# Patient Record
Sex: Female | Born: 1935 | Race: White
Health system: Southern US, Community
[De-identification: ages and names within clinical notes are randomized; demographics above are authoritative.]

---

## 2004-07-05 ENCOUNTER — Inpatient Hospital Stay: Payer: Self-pay

## 2004-07-05 ENCOUNTER — Other Ambulatory Visit: Payer: Self-pay

## 2004-07-19 ENCOUNTER — Other Ambulatory Visit: Payer: Self-pay

## 2004-07-19 ENCOUNTER — Emergency Department: Payer: Self-pay | Admitting: General Practice

## 2004-07-25 ENCOUNTER — Ambulatory Visit: Payer: Self-pay | Admitting: Unknown Physician Specialty

## 2004-10-10 ENCOUNTER — Ambulatory Visit: Payer: Self-pay

## 2004-11-15 ENCOUNTER — Ambulatory Visit: Payer: Self-pay

## 2004-11-16 ENCOUNTER — Inpatient Hospital Stay: Payer: Self-pay | Admitting: Anesthesiology

## 2005-02-01 ENCOUNTER — Emergency Department: Payer: Self-pay | Admitting: Internal Medicine

## 2005-03-01 ENCOUNTER — Ambulatory Visit: Payer: Self-pay | Admitting: Physician Assistant

## 2005-03-25 ENCOUNTER — Ambulatory Visit: Payer: Self-pay | Admitting: Pain Medicine

## 2005-04-04 ENCOUNTER — Ambulatory Visit: Payer: Self-pay | Admitting: Pain Medicine

## 2005-04-29 ENCOUNTER — Ambulatory Visit: Payer: Self-pay | Admitting: Physician Assistant

## 2005-05-08 ENCOUNTER — Ambulatory Visit: Payer: Self-pay | Admitting: Physician Assistant

## 2005-05-08 ENCOUNTER — Encounter: Payer: Self-pay | Admitting: Pain Medicine

## 2005-05-16 ENCOUNTER — Ambulatory Visit: Payer: Self-pay | Admitting: Pain Medicine

## 2005-05-17 ENCOUNTER — Encounter: Payer: Self-pay | Admitting: Pain Medicine

## 2005-05-30 ENCOUNTER — Ambulatory Visit: Payer: Self-pay | Admitting: Pain Medicine

## 2005-06-06 ENCOUNTER — Ambulatory Visit: Payer: Self-pay | Admitting: Otolaryngology

## 2005-06-06 ENCOUNTER — Other Ambulatory Visit: Payer: Self-pay

## 2005-06-12 ENCOUNTER — Ambulatory Visit: Payer: Self-pay | Admitting: Otolaryngology

## 2005-08-15 ENCOUNTER — Ambulatory Visit: Payer: Self-pay | Admitting: Physician Assistant

## 2005-08-22 ENCOUNTER — Ambulatory Visit: Payer: Self-pay | Admitting: Pain Medicine

## 2005-08-30 ENCOUNTER — Ambulatory Visit: Payer: Self-pay | Admitting: Physician Assistant

## 2005-10-07 ENCOUNTER — Ambulatory Visit: Payer: Self-pay | Admitting: Physician Assistant

## 2005-10-15 ENCOUNTER — Ambulatory Visit: Payer: Self-pay | Admitting: Pain Medicine

## 2006-05-15 ENCOUNTER — Ambulatory Visit: Payer: Self-pay | Admitting: Unknown Physician Specialty

## 2006-06-09 ENCOUNTER — Other Ambulatory Visit: Payer: Self-pay

## 2006-06-09 ENCOUNTER — Ambulatory Visit: Payer: Self-pay | Admitting: Orthopaedic Surgery

## 2006-06-12 ENCOUNTER — Ambulatory Visit: Payer: Self-pay | Admitting: Orthopaedic Surgery

## 2006-06-24 ENCOUNTER — Encounter: Payer: Self-pay | Admitting: Orthopaedic Surgery

## 2006-07-17 ENCOUNTER — Encounter: Payer: Self-pay | Admitting: Orthopaedic Surgery

## 2006-09-20 ENCOUNTER — Emergency Department: Payer: Self-pay | Admitting: Emergency Medicine

## 2006-10-13 ENCOUNTER — Ambulatory Visit: Payer: Self-pay | Admitting: Vascular Surgery

## 2006-10-22 ENCOUNTER — Ambulatory Visit: Payer: Self-pay | Admitting: Gastroenterology

## 2006-12-09 ENCOUNTER — Ambulatory Visit: Payer: Self-pay | Admitting: Physician Assistant

## 2006-12-18 ENCOUNTER — Ambulatory Visit: Payer: Self-pay | Admitting: Pain Medicine

## 2007-01-19 ENCOUNTER — Ambulatory Visit: Payer: Self-pay | Admitting: Physician Assistant

## 2007-01-30 IMAGING — CR DG HIP COMPLETE 2+V*L*
1 series · 2 of 2 positions shown · non-contrast
Comparison: none

REASON FOR EXAM: Pain in hip
COMMENTS:

PROCEDURE:     DXR - DXR HIP LEFT COMPLETE  - February 01, 2005  [DATE]
RESULT:     The bones are osteopenic.  There does not appear to be evidence
of fracture or dislocation.  Degenerative changes are appreciated involving
the LEFT femoral acetabular joint.

[Series 1: view not recorded · 0.17mm/px · 2 of 2 slices shown]
[im 1/2]
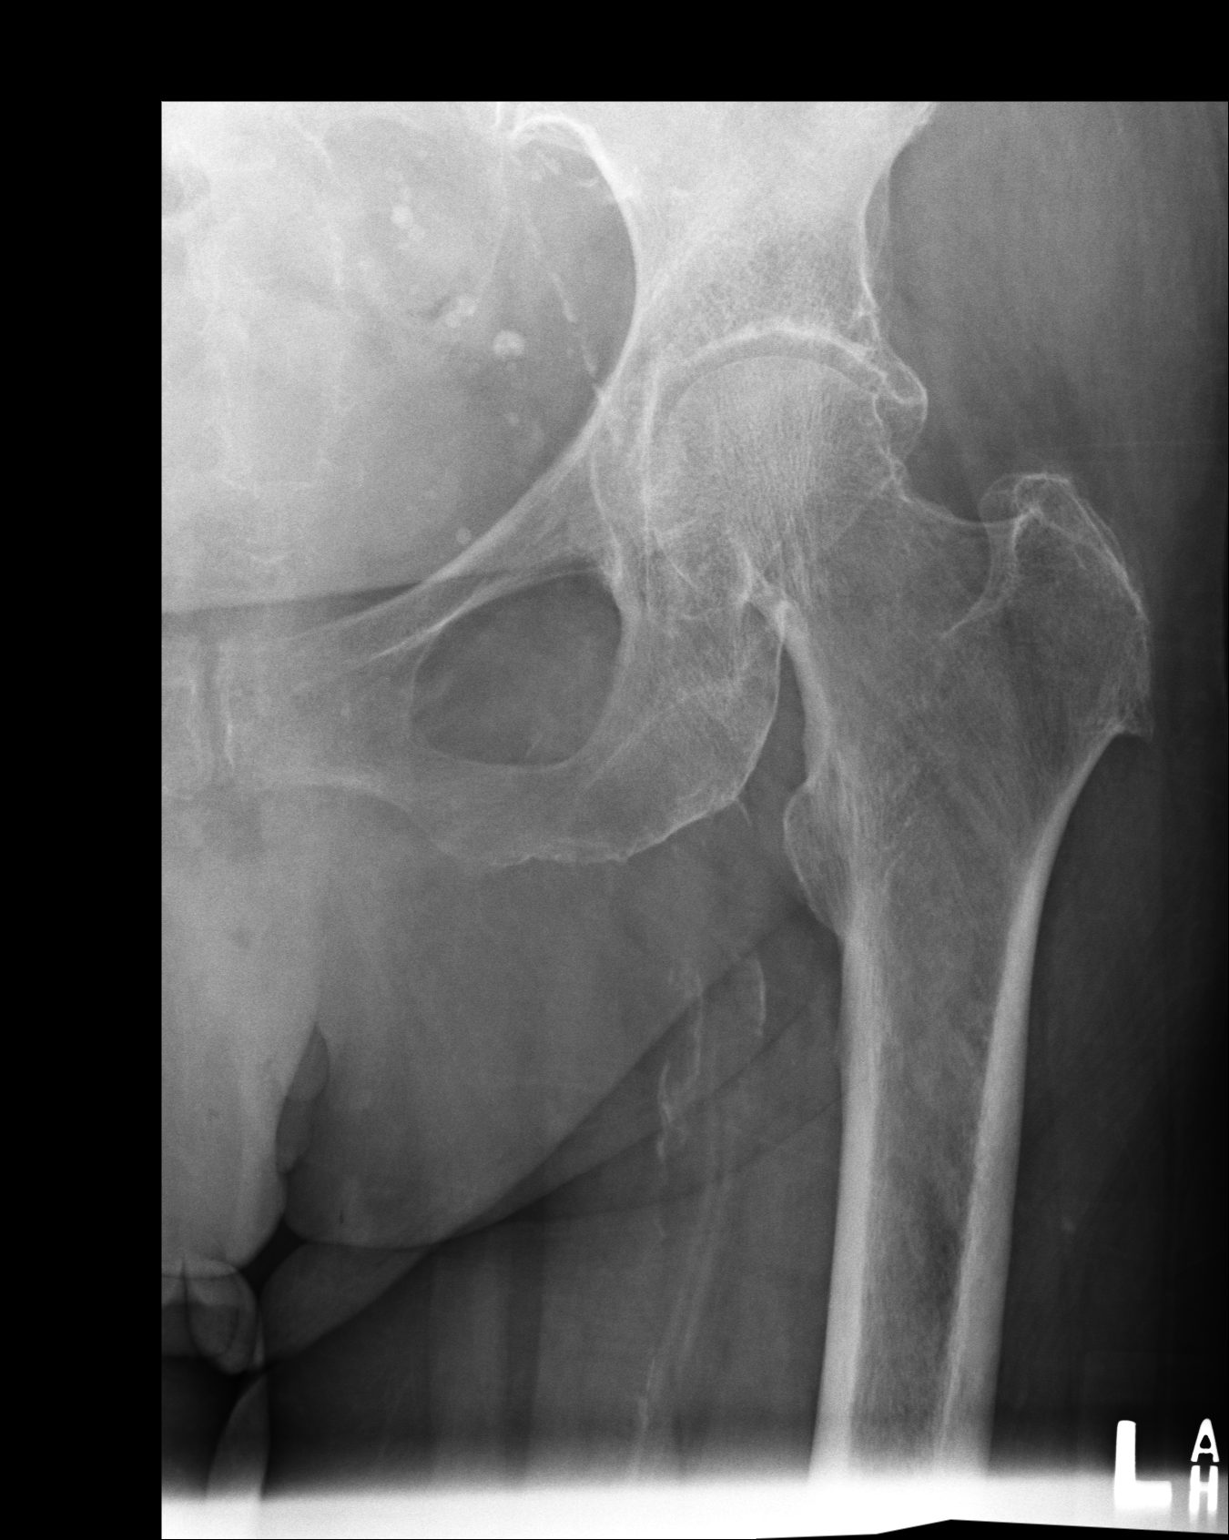
[im 2/2]
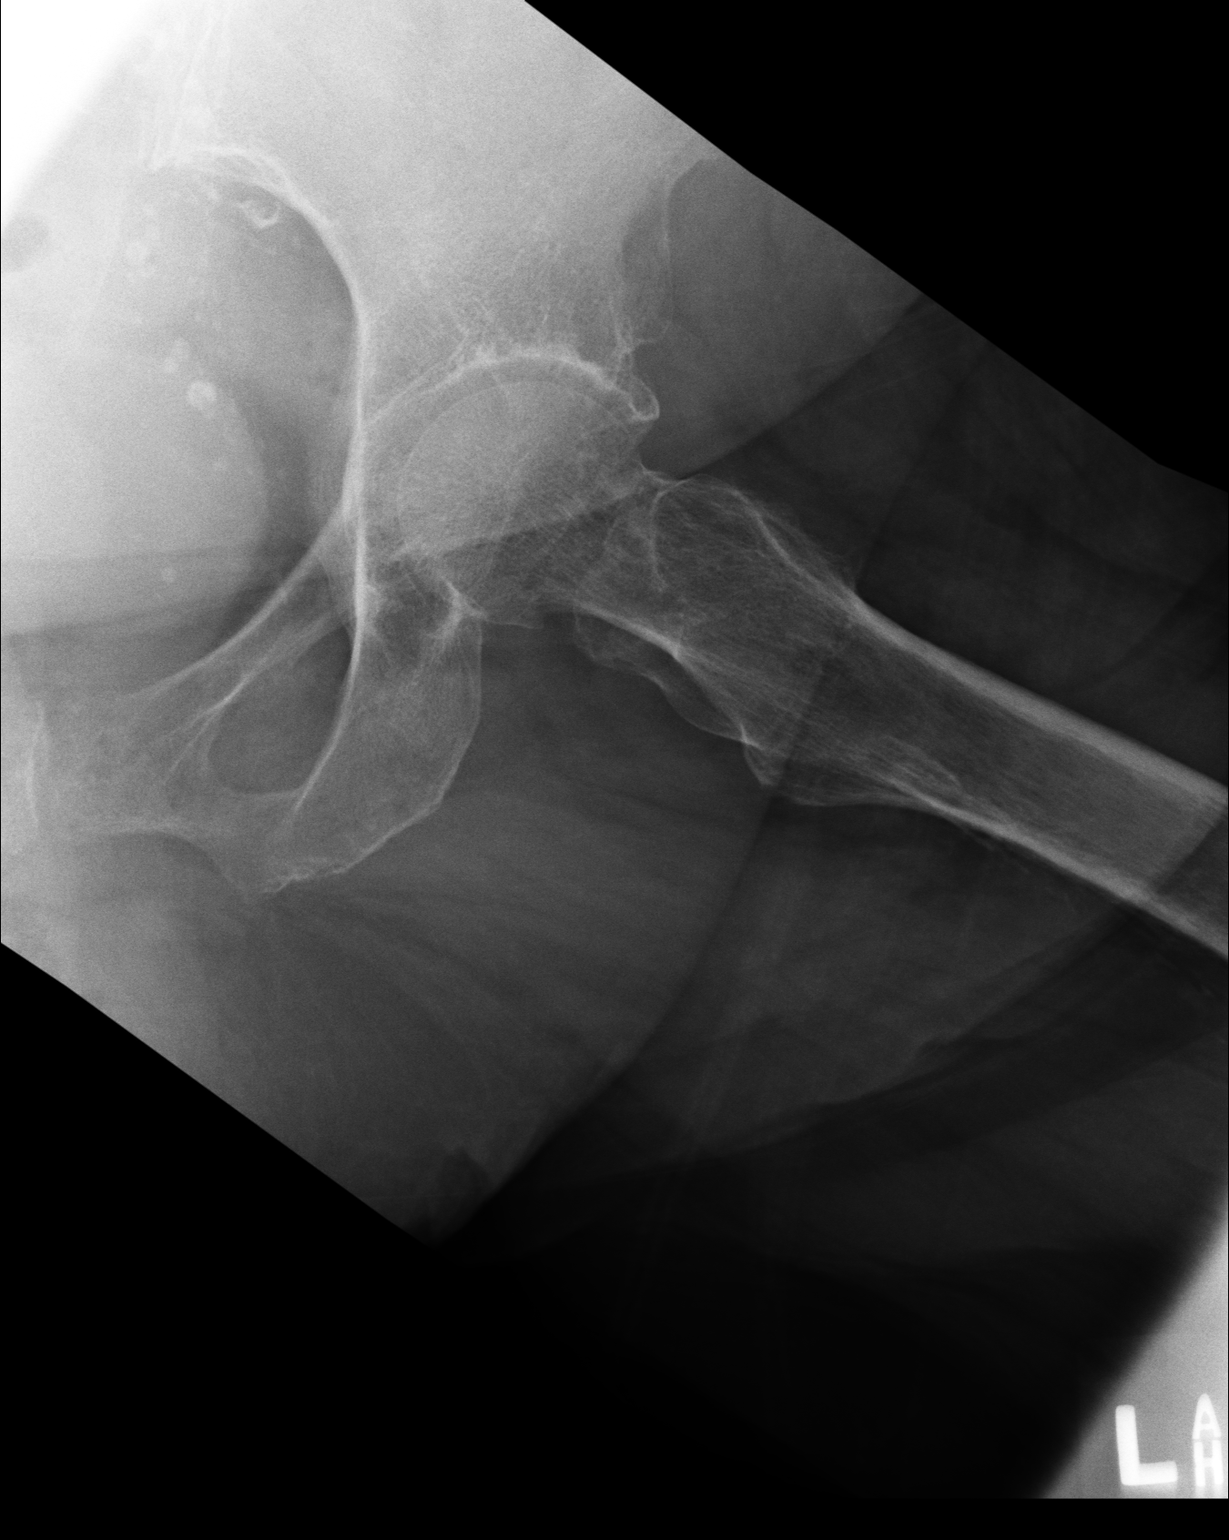

[2 of 2 positions shown; findings below may reference images not displayed]

IMPRESSION: See above.

## 2007-01-30 IMAGING — CR DG ABDOMEN 1V
1 series · 2 of 2 positions shown · non-contrast
Comparison: none

REASON FOR EXAM: Pain
COMMENTS:

[Series 1: view not recorded · 0.17mm/px · 2 of 2 slices shown]
[im 1/2]
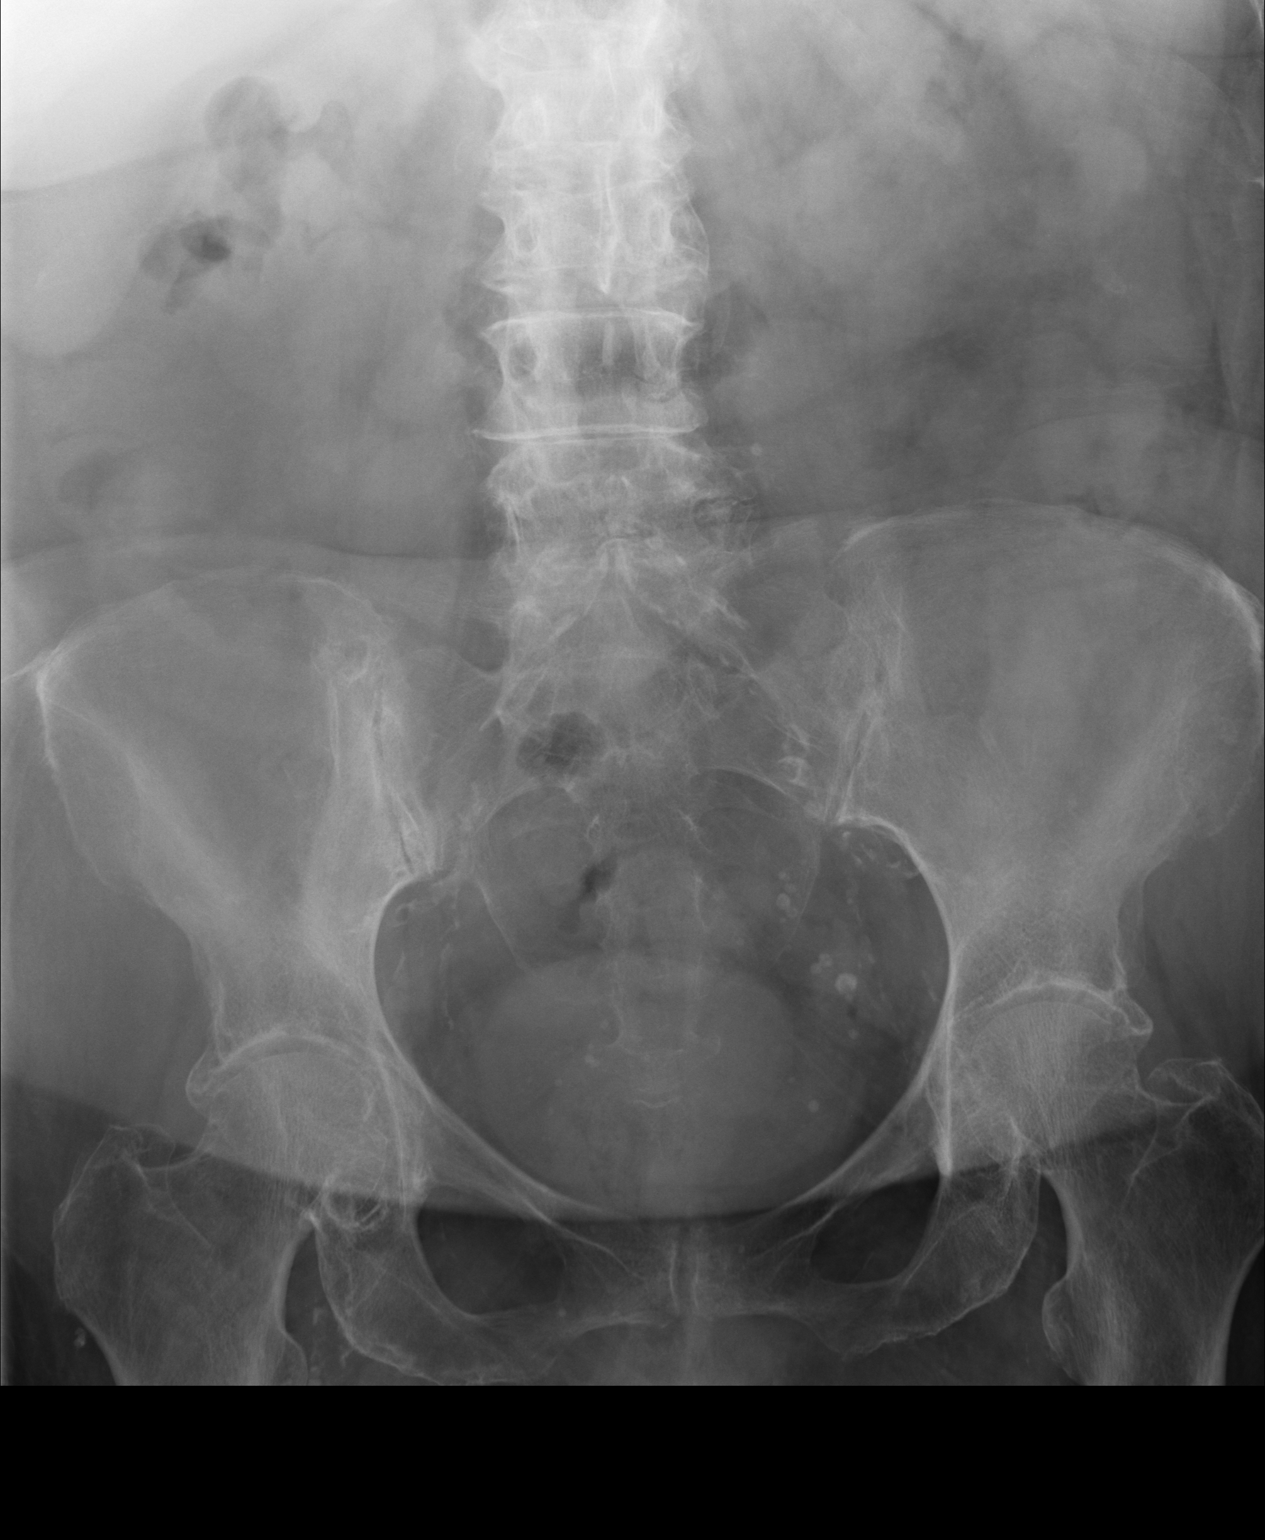
[im 2/2]
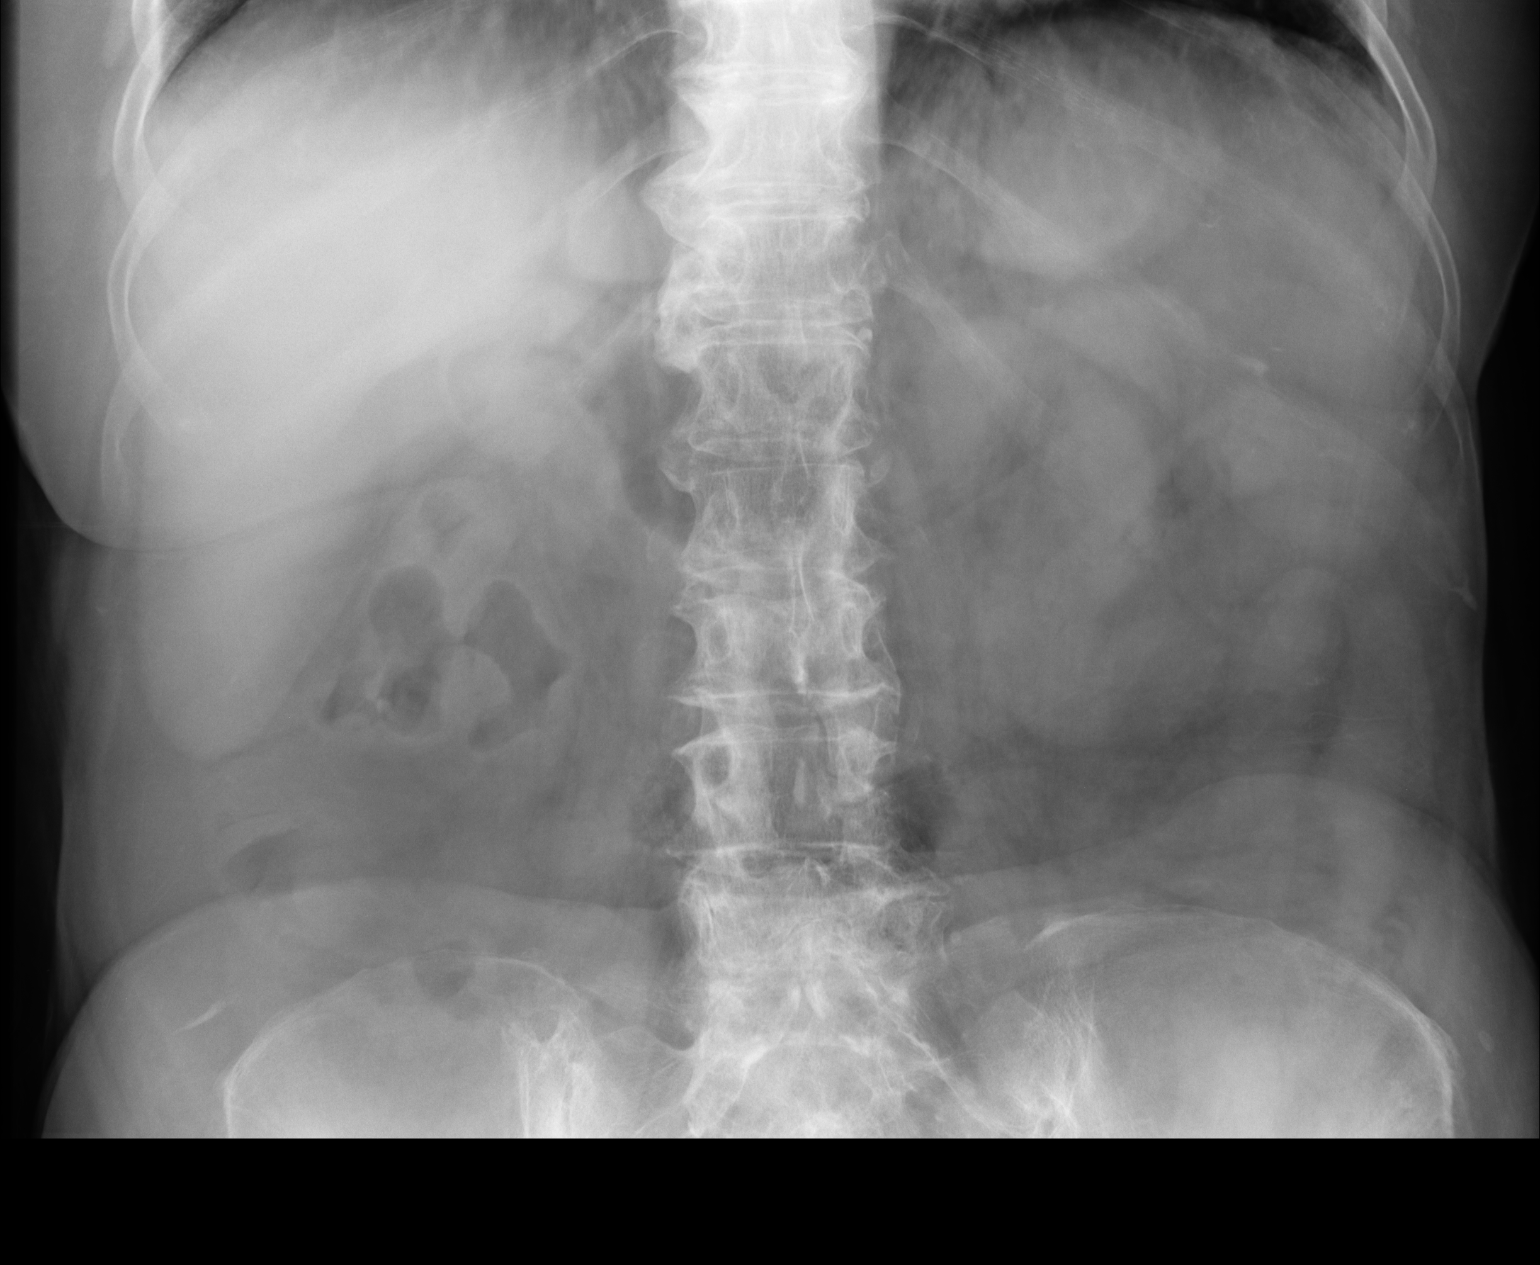

[2 of 2 positions shown; findings below may reference images not displayed]

PROCEDURE:     DXR - DXR KIDNEY URETER BLADDER  - February 01, 2005  [DATE]

RESULT:     Comparison is made to the prior study dated 03-24-00.

Air is seen within nondilated loops of large and small bowel.  There is a
paucity of bowel gas.  A moderate to large amount of stool is appreciated
throughout the abdomen.  Phleboliths are demonstrated within the pelvis as
well as vascular calcifications.  The visualized bony skeleton demonstrates
degenerative changes within the lower lumbar spine as well as involvement of
the sacroiliac joints and femoral acetabular joints.  The urinary bladder is
distended with urine.
IMPRESSION: Nonobstructive bowel gas pattern with degenerative changes within the
skeleton as described above.

## 2007-02-03 ENCOUNTER — Ambulatory Visit: Payer: Self-pay | Admitting: Pain Medicine

## 2007-02-11 ENCOUNTER — Ambulatory Visit: Payer: Self-pay | Admitting: Physician Assistant

## 2007-03-25 ENCOUNTER — Ambulatory Visit: Payer: Self-pay | Admitting: Physician Assistant

## 2007-04-10 ENCOUNTER — Ambulatory Visit: Payer: Self-pay | Admitting: Family Medicine

## 2007-04-13 ENCOUNTER — Inpatient Hospital Stay: Payer: Self-pay | Admitting: Gastroenterology

## 2007-04-28 ENCOUNTER — Ambulatory Visit: Payer: Self-pay | Admitting: Physician Assistant

## 2007-05-06 IMAGING — CR DG HIP COMPLETE 2+V*L*
1 series · 2 of 2 positions shown · non-contrast
Comparison: none

REASON FOR EXAM: LEFT hip pain (telephone results to physician)
COMMENTS:

PROCEDURE:     DXR - DXR HIP LEFT COMPLETE  - May 08, 2005  [DATE]
RESULT:     AP and lateral views reveal the bones to be mildly osteoporotic.
 No fractures, no dislocations.  Vascular calcifications are seen.

[Series 1: view not recorded · 0.17mm/px · 2 of 2 slices shown]
[im 1/2]
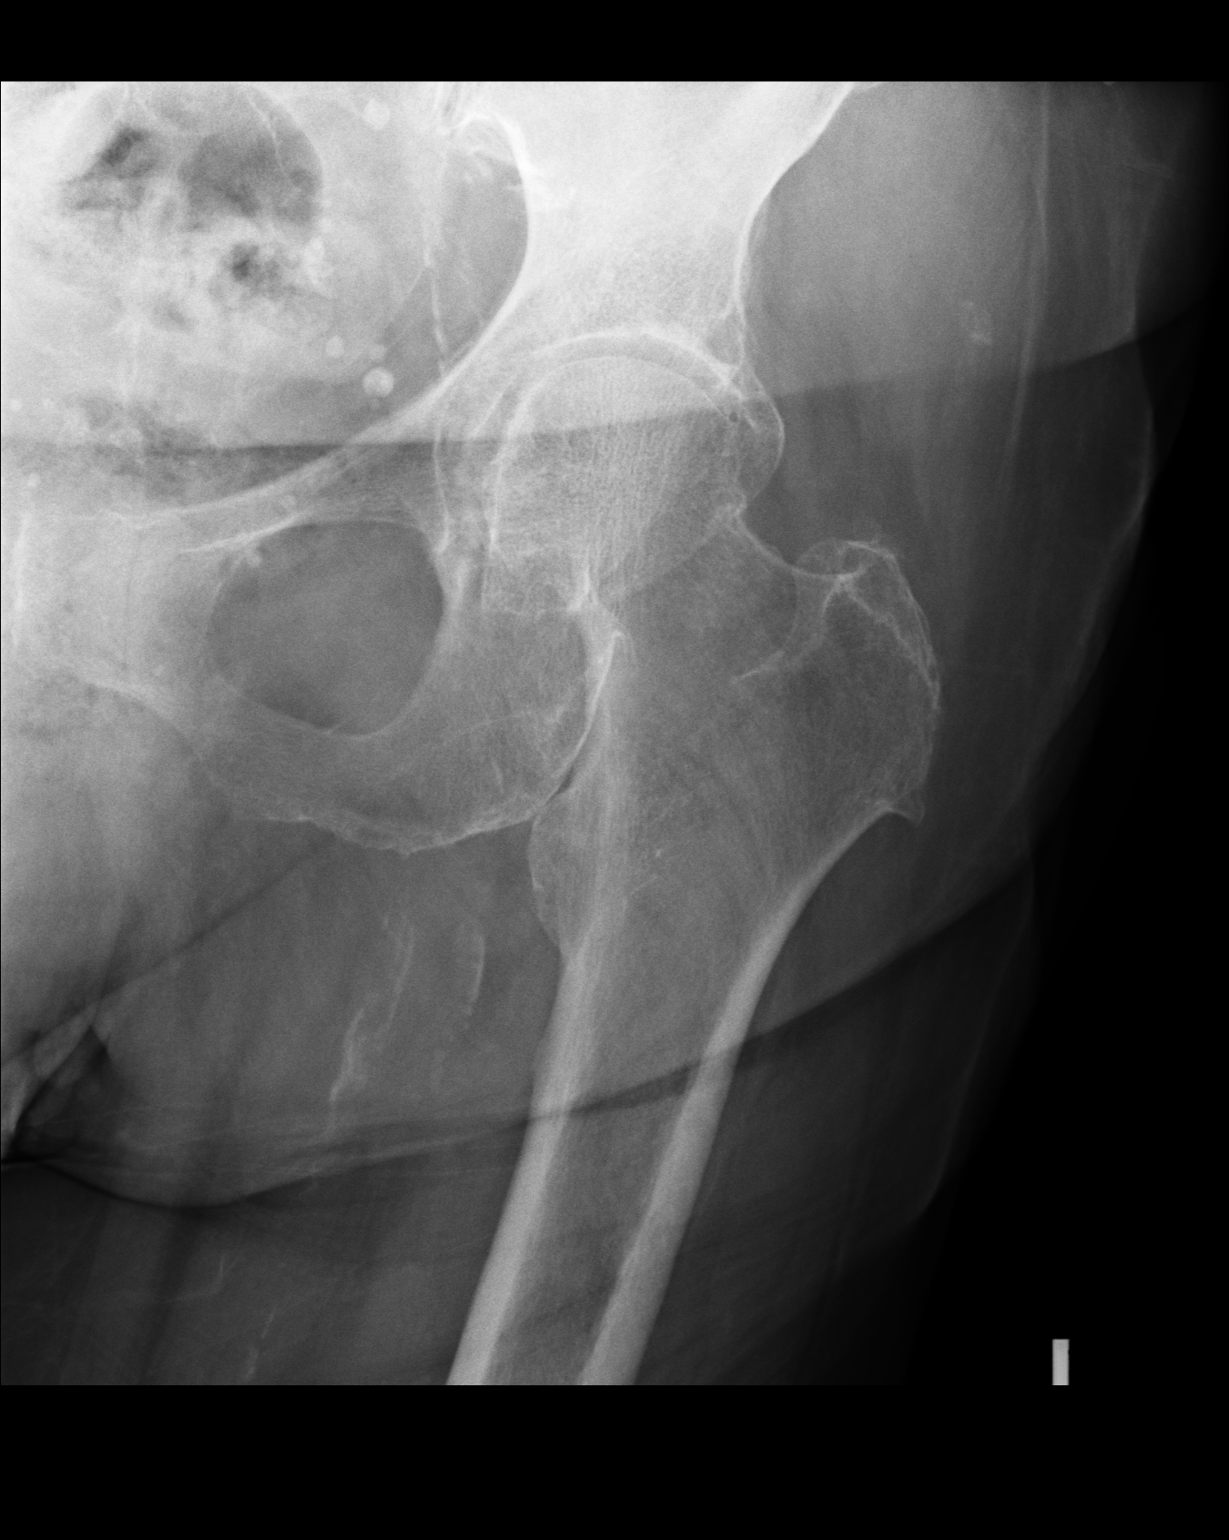
[im 2/2]
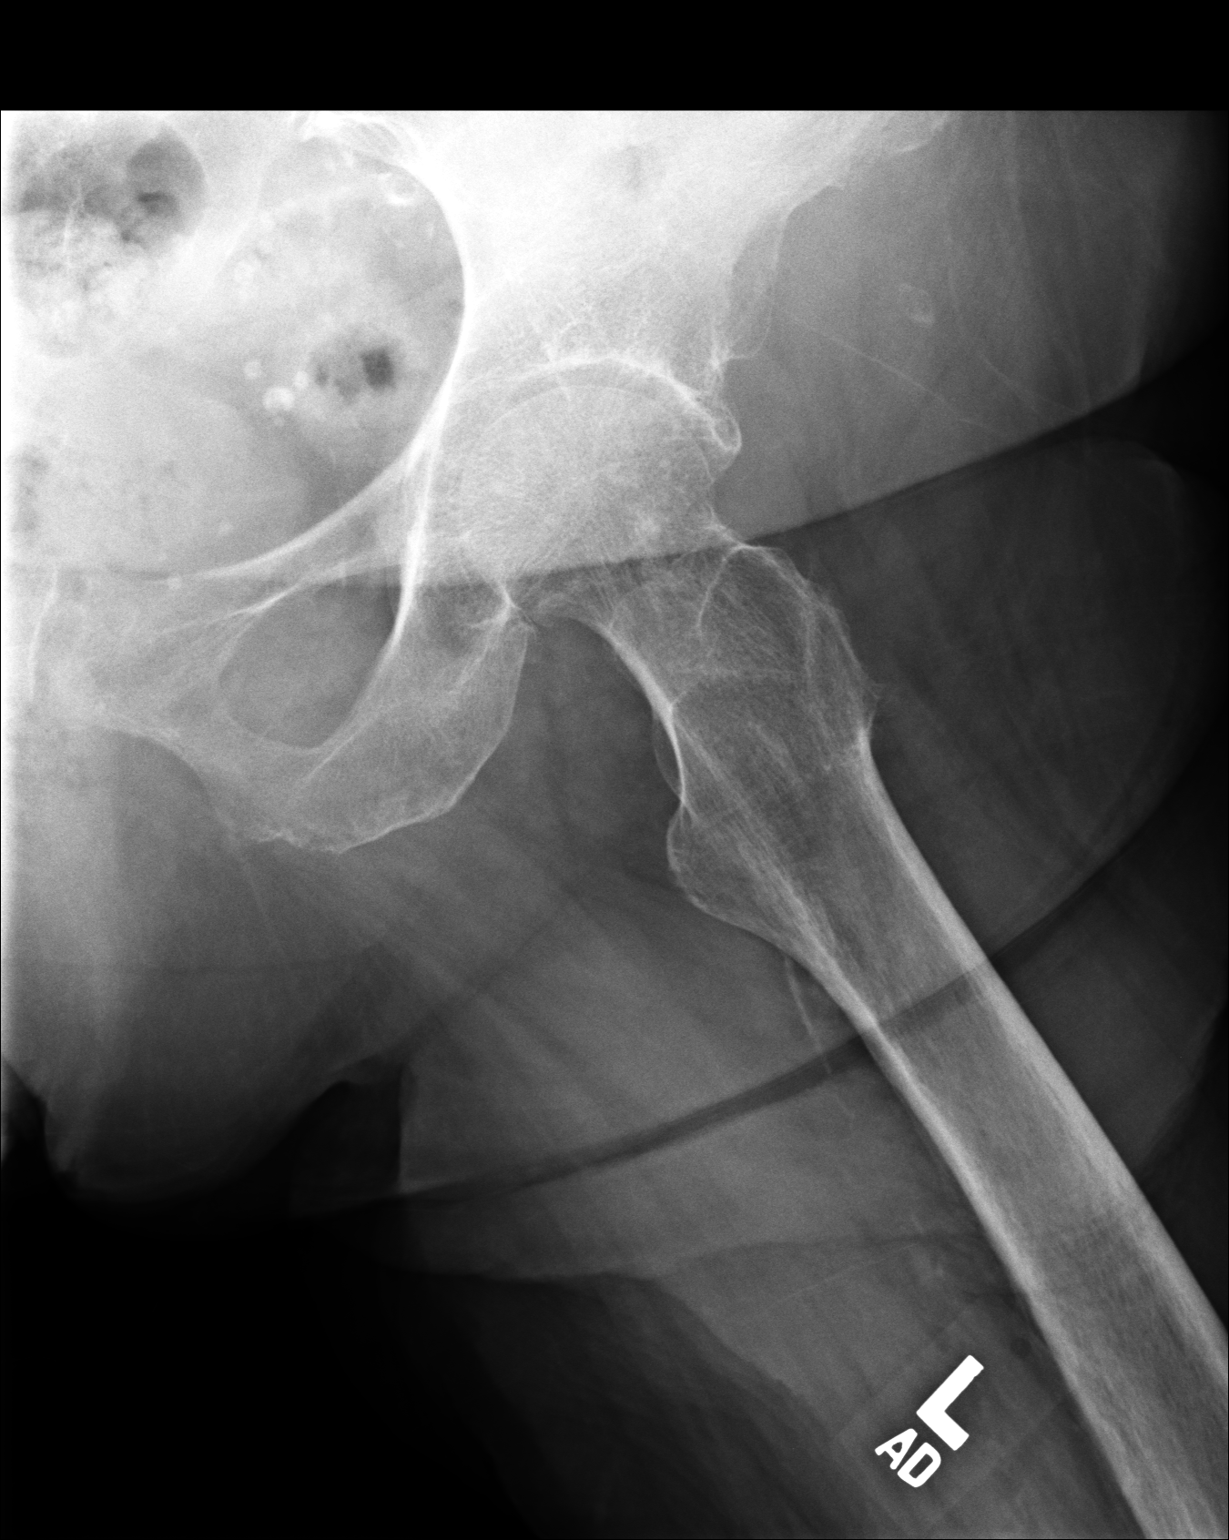

[2 of 2 positions shown; findings below may reference images not displayed]

IMPRESSION: No significant bony abnormality is detected.

## 2007-12-09 ENCOUNTER — Ambulatory Visit: Payer: Self-pay | Admitting: Unknown Physician Specialty

## 2008-02-08 ENCOUNTER — Encounter: Payer: Self-pay | Admitting: Unknown Physician Specialty

## 2008-02-15 ENCOUNTER — Encounter: Payer: Self-pay | Admitting: Unknown Physician Specialty

## 2008-11-23 ENCOUNTER — Inpatient Hospital Stay: Payer: Self-pay | Admitting: Internal Medicine

## 2009-01-03 ENCOUNTER — Emergency Department: Payer: Self-pay | Admitting: Emergency Medicine

## 2009-01-19 ENCOUNTER — Ambulatory Visit: Payer: Self-pay | Admitting: Unknown Physician Specialty

## 2010-04-22 ENCOUNTER — Inpatient Hospital Stay: Payer: Self-pay | Admitting: Specialist

## 2010-06-11 ENCOUNTER — Ambulatory Visit: Payer: Self-pay | Admitting: Internal Medicine

## 2011-04-01 ENCOUNTER — Ambulatory Visit: Payer: Self-pay | Admitting: Gastroenterology

## 2011-04-04 LAB — PATHOLOGY REPORT

## 2011-07-13 ENCOUNTER — Inpatient Hospital Stay: Payer: Self-pay | Admitting: Internal Medicine

## 2011-07-19 ENCOUNTER — Encounter: Payer: Self-pay | Admitting: Internal Medicine

## 2011-08-17 ENCOUNTER — Encounter: Payer: Self-pay | Admitting: Internal Medicine

## 2011-11-05 ENCOUNTER — Ambulatory Visit: Payer: Self-pay | Admitting: Surgery

## 2011-11-06 LAB — URINALYSIS, COMPLETE
Bacteria: NONE SEEN
Bilirubin,UR: NEGATIVE
Glucose,UR: NEGATIVE mg/dL (ref 0–75)
Ketone: NEGATIVE
Leukocyte Esterase: NEGATIVE
Nitrite: NEGATIVE
Specific Gravity: 1.005 (ref 1.003–1.030)
Squamous Epithelial: <1
WBC UR: 12 /HPF (ref 0–5)

## 2011-11-14 LAB — PATHOLOGY REPORT

## 2011-11-15 ENCOUNTER — Inpatient Hospital Stay: Payer: Self-pay | Admitting: Emergency Medicine

## 2011-11-16 LAB — PLATELET COUNT: Platelet: 139 10*3/uL — ABNORMAL LOW (ref 150–440)

## 2012-04-10 ENCOUNTER — Other Ambulatory Visit: Payer: Self-pay | Admitting: Gastroenterology

## 2012-04-10 LAB — WBCS, STOOL

## 2012-04-12 LAB — STOOL CULTURE

## 2012-05-27 ENCOUNTER — Ambulatory Visit: Payer: Self-pay | Admitting: Gastroenterology

## 2012-06-02 LAB — PATHOLOGY REPORT

## 2013-05-14 ENCOUNTER — Inpatient Hospital Stay: Payer: Self-pay | Admitting: Internal Medicine

## 2013-05-14 ENCOUNTER — Inpatient Hospital Stay (HOSPITAL_COMMUNITY)
Admission: AD | Admit: 2013-05-14 | Payer: Self-pay | Source: Other Acute Inpatient Hospital | Admitting: Pulmonary Disease

## 2013-05-14 LAB — COMPREHENSIVE METABOLIC PANEL
Albumin: 3 g/dL — ABNORMAL LOW (ref 3.4–5.0)
Anion Gap: 9 (ref 7–16)
BUN: 51 mg/dL — ABNORMAL HIGH (ref 7–18)
Bilirubin,Total: 0.4 mg/dL (ref 0.2–1.0)
Chloride: 97 mmol/L — ABNORMAL LOW (ref 98–107)
Co2: 26 mmol/L (ref 21–32)
Creatinine: 1.19 mg/dL (ref 0.60–1.30)
EGFR (Non-African Amer.): 44 — ABNORMAL LOW
Osmolality: 281 (ref 275–301)
SGOT(AST): 23 U/L (ref 15–37)
SGPT (ALT): 13 U/L (ref 12–78)
Sodium: 132 mmol/L — ABNORMAL LOW (ref 136–145)
Total Protein: 7.3 g/dL (ref 6.4–8.2)

## 2013-05-14 LAB — URINALYSIS, COMPLETE
Glucose,UR: NEGATIVE mg/dL (ref 0–75)
Nitrite: POSITIVE
RBC,UR: 32 /HPF (ref 0–5)
Specific Gravity: 1.017 (ref 1.003–1.030)
Squamous Epithelial: 62
Transitional Epi: 1
WBC UR: 3818 /HPF (ref 0–5)

## 2013-05-14 LAB — CBC
HCT: 34.2 % — ABNORMAL LOW (ref 35.0–47.0)
MCHC: 34.8 g/dL (ref 32.0–36.0)
MCV: 100 fL (ref 80–100)
Platelet: 395 10*3/uL (ref 150–440)
RBC: 3.44 10*6/uL — ABNORMAL LOW (ref 3.80–5.20)
RDW: 20.9 % — ABNORMAL HIGH (ref 11.5–14.5)
WBC: 7.6 10*3/uL (ref 3.6–11.0)

## 2013-05-14 LAB — CLOSTRIDIUM DIFFICILE BY PCR

## 2013-05-14 LAB — SEDIMENTATION RATE: Erythrocyte Sed Rate: 74 mm/hr — ABNORMAL HIGH (ref 0–30)

## 2013-05-15 LAB — CBC WITH DIFFERENTIAL/PLATELET
Basophil #: 0 10*3/uL (ref 0.0–0.1)
Basophil %: 0.5 %
Eosinophil %: 1.3 %
HGB: 9.4 g/dL — ABNORMAL LOW (ref 12.0–16.0)
Lymphocyte %: 18.1 %
MCH: 35.4 pg — ABNORMAL HIGH (ref 26.0–34.0)
MCHC: 35.4 g/dL (ref 32.0–36.0)
MCV: 100 fL (ref 80–100)
Monocyte #: 0.1 x10 3/mm — ABNORMAL LOW (ref 0.2–0.9)
Neutrophil %: 78.1 %
Platelet: 271 10*3/uL (ref 150–440)
RBC: 2.67 10*6/uL — ABNORMAL LOW (ref 3.80–5.20)

## 2013-05-15 LAB — BASIC METABOLIC PANEL
Anion Gap: 11 (ref 7–16)
Calcium, Total: 8 mg/dL — ABNORMAL LOW (ref 8.5–10.1)
Co2: 21 mmol/L (ref 21–32)
Creatinine: 1.05 mg/dL (ref 0.60–1.30)
EGFR (African American): 60 — ABNORMAL LOW
EGFR (Non-African Amer.): 52 — ABNORMAL LOW
Osmolality: 285 (ref 275–301)
Potassium: 3.1 mmol/L — ABNORMAL LOW (ref 3.5–5.1)
Sodium: 137 mmol/L (ref 136–145)

## 2013-05-15 LAB — TROPONIN I: Troponin-I: 0.4 ng/mL — ABNORMAL HIGH

## 2013-05-15 LAB — MAGNESIUM: Magnesium: 1 mg/dL — ABNORMAL LOW

## 2013-05-16 LAB — MAGNESIUM: Magnesium: 2.1 mg/dL

## 2013-05-16 LAB — BASIC METABOLIC PANEL
BUN: 25 mg/dL — ABNORMAL HIGH (ref 7–18)
Co2: 22 mmol/L (ref 21–32)
Creatinine: 0.86 mg/dL (ref 0.60–1.30)
EGFR (Non-African Amer.): >60
Glucose: 111 mg/dL — ABNORMAL HIGH (ref 65–99)
Osmolality: 279 (ref 275–301)
Potassium: 3.6 mmol/L (ref 3.5–5.1)
Sodium: 137 mmol/L (ref 136–145)

## 2013-05-16 LAB — CBC WITH DIFFERENTIAL/PLATELET
Basophil #: 0 10*3/uL (ref 0.0–0.1)
HCT: 24.2 % — ABNORMAL LOW (ref 35.0–47.0)
HGB: 8.4 g/dL — ABNORMAL LOW (ref 12.0–16.0)
Lymphocyte #: 0.6 10*3/uL — ABNORMAL LOW (ref 1.0–3.6)
MCH: 34.6 pg — ABNORMAL HIGH (ref 26.0–34.0)
Neutrophil #: 2.7 10*3/uL (ref 1.4–6.5)
Neutrophil %: 79.5 %
Platelet: 189 10*3/uL (ref 150–440)
RBC: 2.43 10*6/uL — ABNORMAL LOW (ref 3.80–5.20)

## 2013-05-16 LAB — OCCULT BLOOD X 1 CARD TO LAB, STOOL: Occult Blood, Feces: NEGATIVE

## 2013-05-17 ENCOUNTER — Ambulatory Visit: Payer: Self-pay | Admitting: Oncology

## 2013-05-17 LAB — CBC WITH DIFFERENTIAL/PLATELET
Basophil %: 0.5 %
Eosinophil %: 1.6 %
HCT: 24.4 % — ABNORMAL LOW (ref 35.0–47.0)
HGB: 8.7 g/dL — ABNORMAL LOW (ref 12.0–16.0)
Lymphocyte %: 23.1 %
MCH: 35.1 pg — ABNORMAL HIGH (ref 26.0–34.0)
Monocyte #: 0 x10 3/mm — ABNORMAL LOW (ref 0.2–0.9)
Neutrophil #: 2.1 10*3/uL (ref 1.4–6.5)
Neutrophil %: 74.3 %
Platelet: 172 10*3/uL (ref 150–440)
RBC: 2.47 10*6/uL — ABNORMAL LOW (ref 3.80–5.20)
RDW: 19.8 % — ABNORMAL HIGH (ref 11.5–14.5)

## 2013-05-17 LAB — BASIC METABOLIC PANEL
BUN: 14 mg/dL (ref 7–18)
Calcium, Total: 8.5 mg/dL (ref 8.5–10.1)
Chloride: 108 mmol/L — ABNORMAL HIGH (ref 98–107)
Creatinine: 0.89 mg/dL (ref 0.60–1.30)
EGFR (African American): >60
Glucose: 117 mg/dL — ABNORMAL HIGH (ref 65–99)
Osmolality: 277 (ref 275–301)
Potassium: 3.6 mmol/L (ref 3.5–5.1)

## 2013-05-17 LAB — STOOL CULTURE

## 2013-05-17 LAB — WBCS, STOOL

## 2013-05-18 LAB — CBC WITH DIFFERENTIAL/PLATELET
Basophil %: 0.3 %
Eosinophil %: 0.8 %
HCT: 25.7 % — ABNORMAL LOW (ref 35.0–47.0)
HGB: 9.1 g/dL — ABNORMAL LOW (ref 12.0–16.0)
MCH: 34.6 pg — ABNORMAL HIGH (ref 26.0–34.0)
MCHC: 35.2 g/dL (ref 32.0–36.0)
MCV: 98 fL (ref 80–100)
Neutrophil %: 72.8 %
RDW: 19.9 % — ABNORMAL HIGH (ref 11.5–14.5)

## 2013-05-21 LAB — BASIC METABOLIC PANEL
Anion Gap: 9 (ref 7–16)
Calcium, Total: 7.3 mg/dL — ABNORMAL LOW (ref 8.5–10.1)
Chloride: 102 mmol/L (ref 98–107)
Creatinine: 0.69 mg/dL (ref 0.60–1.30)
EGFR (African American): >60
EGFR (Non-African Amer.): >60
Glucose: 154 mg/dL — ABNORMAL HIGH (ref 65–99)
Osmolality: 273 (ref 275–301)
Sodium: 137 mmol/L (ref 136–145)

## 2013-05-21 LAB — CBC WITH DIFFERENTIAL/PLATELET
Eosinophil %: 2.9 %
HCT: 23.5 % — ABNORMAL LOW (ref 35.0–47.0)
MCH: 34.2 pg — ABNORMAL HIGH (ref 26.0–34.0)
MCHC: 35.2 g/dL (ref 32.0–36.0)
MCV: 97 fL (ref 80–100)
Monocyte #: 0.1 x10 3/mm — ABNORMAL LOW (ref 0.2–0.9)
Monocyte %: 8.3 %
Neutrophil #: 0.8 10*3/uL — ABNORMAL LOW (ref 1.4–6.5)
Neutrophil %: 53.7 %
RBC: 2.41 10*6/uL — ABNORMAL LOW (ref 3.80–5.20)

## 2013-05-22 LAB — CBC WITH DIFFERENTIAL/PLATELET
Eosinophil #: 0.1 10*3/uL (ref 0.0–0.7)
Eosinophil %: 4.9 %
HCT: 29.1 % — ABNORMAL LOW (ref 35.0–47.0)
HGB: 10.3 g/dL — ABNORMAL LOW (ref 12.0–16.0)
MCH: 34.1 pg — ABNORMAL HIGH (ref 26.0–34.0)
MCHC: 35.5 g/dL (ref 32.0–36.0)
MCV: 96 fL (ref 80–100)
Neutrophil %: 39.4 %
Platelet: 62 10*3/uL — ABNORMAL LOW (ref 150–440)
RBC: 3.03 10*6/uL — ABNORMAL LOW (ref 3.80–5.20)
RDW: 20.2 % — ABNORMAL HIGH (ref 11.5–14.5)
WBC: 1.2 10*3/uL — CL (ref 3.6–11.0)

## 2013-05-22 LAB — LACTATE DEHYDROGENASE: LDH: 226 U/L (ref 81–246)

## 2013-05-22 LAB — COMPREHENSIVE METABOLIC PANEL
Albumin: 2.2 g/dL — ABNORMAL LOW (ref 3.4–5.0)
Anion Gap: 10 (ref 7–16)
BUN: 2 mg/dL — ABNORMAL LOW (ref 7–18)
Calcium, Total: 7.2 mg/dL — ABNORMAL LOW (ref 8.5–10.1)
Chloride: 104 mmol/L (ref 98–107)
Co2: 24 mmol/L (ref 21–32)
Creatinine: 0.51 mg/dL — ABNORMAL LOW (ref 0.60–1.30)
EGFR (African American): >60
EGFR (Non-African Amer.): >60
Osmolality: 275 (ref 275–301)
Potassium: 2.4 mmol/L — CL (ref 3.5–5.1)
SGOT(AST): 16 U/L (ref 15–37)
SGPT (ALT): 9 U/L — ABNORMAL LOW (ref 12–78)
Total Protein: 5.3 g/dL — ABNORMAL LOW (ref 6.4–8.2)

## 2013-05-22 LAB — IRON AND TIBC: Iron Bind.Cap.(Total): 187 ug/dL — ABNORMAL LOW (ref 250–450)

## 2013-05-22 LAB — FOLATE: Folic Acid: 7.8 ng/mL (ref 3.1–100.0)

## 2013-05-22 LAB — FERRITIN: Ferritin (ARMC): 283 ng/mL (ref 8–388)

## 2013-05-23 LAB — CBC WITH DIFFERENTIAL/PLATELET
Eosinophil #: 0 10*3/uL (ref 0.0–0.7)
Eosinophil %: 2.7 %
Lymphocyte #: 0.6 10*3/uL — ABNORMAL LOW (ref 1.0–3.6)
Lymphocyte %: 41.1 %
Monocyte #: 0.3 x10 3/mm (ref 0.2–0.9)
Platelet: 135 10*3/uL — ABNORMAL LOW (ref 150–440)
RBC: 2.75 10*6/uL — ABNORMAL LOW (ref 3.80–5.20)
RDW: 20.6 % — ABNORMAL HIGH (ref 11.5–14.5)

## 2013-05-23 LAB — BASIC METABOLIC PANEL
Anion Gap: 7 (ref 7–16)
BUN: 3 mg/dL — ABNORMAL LOW (ref 7–18)
Chloride: 106 mmol/L (ref 98–107)
EGFR (African American): >60
EGFR (Non-African Amer.): >60
Glucose: 183 mg/dL — ABNORMAL HIGH (ref 65–99)
Osmolality: 279 (ref 275–301)
Sodium: 139 mmol/L (ref 136–145)

## 2013-05-24 LAB — PATHOLOGY REPORT

## 2013-05-25 LAB — CBC WITH DIFFERENTIAL/PLATELET
Basophil %: 1 %
Eosinophil #: 0 10*3/uL (ref 0.0–0.7)
Eosinophil %: 3.1 %
HGB: 8.3 g/dL — ABNORMAL LOW (ref 12.0–16.0)
MCH: 33.7 pg (ref 26.0–34.0)
MCV: 99 fL (ref 80–100)
Monocyte %: 18.6 %
Neutrophil #: 0.5 10*3/uL — ABNORMAL LOW (ref 1.4–6.5)
Neutrophil %: 41.4 %
RBC: 2.47 10*6/uL — ABNORMAL LOW (ref 3.80–5.20)
WBC: 1.3 10*3/uL — CL (ref 3.6–11.0)

## 2013-05-27 LAB — CREATININE, SERUM: EGFR (Non-African Amer.): >60

## 2013-06-16 ENCOUNTER — Ambulatory Visit: Payer: Self-pay | Admitting: Oncology

## 2013-06-16 DEATH — deceased

## 2015-01-06 NOTE — Consult Note (Signed)
History of Present Illness:  Reason for Consult Leukopenia   HPI   Patient is a 80-year-old female with past medical history significant for Crohn's disease who presented to the emergency room with a two-week duration of increasing nausea, vomiting, and diarrhea.  She has had minimal PO intake as well.  She also complained of burning with urination.  She denies any fevers.  She has no neurologic complaints.  She has no chest pain, shortness of breath, or cough.  She has mild abdominal pain.  She denies any melena or hematochezia.  Patient feels generally terrible, but offers no further specific complaints.  PFSH:  Additional Past Medical and Surgical History Current disease, hypertension, diabetes, macular degeneration, anxiety, ORIF of left forearm.  Social history: Patient denies tobacco or alcohol.  Currently resides in a nursing facility.  Family history: CAD.   Review of Systems:  Performance Status (ECOG) 3   Review of Systems   As per HPI. Otherwise, 10 point system review was negative.   NURSING NOTES: **Vital Signs.:   05-Sep-14 07:58   Vital Signs Type: Routine   Temperature Temperature (F): 97.8   Celsius: 36.5   Temperature Source: oral   Pulse Pulse: 100   Respirations Respirations: 21   Systolic BP Systolic BP: 149   Diastolic BP (mmHg) Diastolic BP (mmHg): 84   Mean BP: 105   Pulse Ox % Pulse Ox %: 100   Pulse Ox Activity Level: At rest   Oxygen Delivery: 2L   Physical Exam:  Physical Exam General: hin, no acute distress. Eyes: anicteric sclera. Lungs: Clear to auscultation bilaterally. Heart: Regular rate and rhythm. No rubs, murmurs, or gallops. Abdomen: Soft, nontender, nondistended. No organomegaly noted, normoactive bowel sounds. Musculoskeletal: No edema, cyanosis, or clubbing. Neuro: Alert, answering all questions appropriately. Cranial nerves grossly intact. Skin: No rashes or petechiae noted. Psych: Normal affect.    Remicade:  Anaphylaxis, Other    Norco 325 mg-5 mg oral tablet: 1 tab(s) orally 4 times a day for pain. *no more than 3gm apap/day from all sources*, Status: Active, Quantity: 0, Refills: None   Norco 325 mg-5 mg oral tablet: 1 tab(s) orally every 4 hours as needed for pain. *no more than 3000mg apap/day from all sources*, Status: Active, Quantity: 0, Refills: None   azaTHIOprine: 1 tab (200 milligrams) orally once a day, Status: Active, Quantity: 0, Refills: None   methotrexate 25 mg/mL injectable solution: 1 milliliter(s) intramuscular once a week on Wednesday., Status: Active, Quantity: 0, Refills: None   ZyrTEC 10 mg oral tablet: 1 tab(s) orally once a day for allergies., Status: Active, Quantity: 0, Refills: None   quinapril 40 mg oral tablet: 1 tab(s) orally once a day for htn., Status: Active, Quantity: 0, Refills: None   Lomotil 0.025 mg-2.5 mg oral tablet: 2 tab(s) orally 2 times a day, Status: Active, Quantity: 0, Refills: None   sertraline 100 mg oral tablet: 2 tabs (200mg) orally once a day for depression., Status: Active, Quantity: 0, Refills: None   omeprazole 20 mg oral delayed release tablet: 1 tab(s) orally once a day (in the morning) for gerd., Status: Active, Quantity: 0, Refills: None   NovoLIN N human recombinant 100 units/mL subcutaneous suspension: 15 unit(s) subcutaneous once a day (in the morning) for diabetes mellitus II., Status: Active, Quantity: 0, Refills: None   hydrochlorothiazide 25 mg oral tablet: 1 tab(s) orally once a day for diurectic., Status: Active, Quantity: 0, Refills: None   dicyclomine 10 mg oral capsule: 1 cap(s)   orally 2 times a day for antispasmotic., Status: Active, Quantity: 0, Refills: None   Metoprolol Tartrate 100 mg oral tablet: 1 tab(s) orally 2 times a day for HTN., Status: Active, Quantity: 0, Refills: None   NovoLOG FlexPen 100 units/mL subcutaneous solution: 5 unit(s) subcutaneous 2 times a day for diabetes mellitus II., Status: Active,  Quantity: 0, Refills: None   Imodium A-D 2 mg oral tablet: 1 tab(s) orally after each loose stools every 3 hours as needed for diarrhea. *do not exceed 16mg*, Status: Active, Quantity: 0, Refills: None   metFORMIN 500 mg oral tablet: 1 tab(s) orally 2 times a day for diabetes mellitus., Status: Active, Quantity: 0, Refills: None   Tylenol 500 mg oral tablet: 2 tabs (1000mg) orally 2 times a day for pain. *no more than 3gm of apap/day*, Status: Active, Quantity: 0, Refills: None   multivitamin: 1 tab(s) orally 2 times a day for supplement., Status: Active, Quantity: 0, Refills: None   Oyster Shell Calcium 500 1250 mg oral tablet: 1 tab(s) orally 2 times a day for supplement., Status: Active, Quantity: 0, Refills: None   magnesium: 1 tab (400 milligrams) orally 2 times a day for supplement., Status: Active, Quantity: 0, Refills: None  Laboratory Results: Routine Chem:  05-Sep-14 11:54   Glucose, Serum  154  BUN  3  Creatinine (comp) 0.69  Sodium, Serum 137  Potassium, Serum  2.6  Chloride, Serum 102  CO2, Serum 26  Calcium (Total), Serum  7.3  Anion Gap 9  Osmolality (calc) 273  eGFR (African American) >60  eGFR (Non-African American) >60 (eGFR values <60mL/min/1.73 m2 may be an indication of chronic kidney disease (CKD). Calculated eGFR is useful in patients with stable renal function. The eGFR calculation will not be reliable in acutely ill patients when serum creatinine is changing rapidly. It is not useful in  patients on dialysis. The eGFR calculation may not be applicable to patients at the low and high extremes of body sizes, pregnant women, and vegetarians.)  Result Comment WBC - RESULTS VERIFIED BY REPEAT TESTING.  - NOTIFIED OF CRITICAL VALUE  - C/CRYSTAL MINTER AT 1219 05/21/13-LAB  - READ-BACK PROCESS PERFORMED.  Result(s) reported on 21 May 2013 at 12:29PM.  Routine Hem:  05-Sep-14 11:54   WBC (CBC)  1.5  RBC (CBC)  2.41  Hemoglobin (CBC)  8.3  Hematocrit (CBC)   23.5  Platelet Count (CBC)  73  MCV 97  MCH  34.2  MCHC 35.2  RDW  20.2  Neutrophil % 53.7  Lymphocyte % 34.5  Monocyte % 8.3  Eosinophil % 2.9  Basophil % 0.6  Neutrophil #  0.8  Lymphocyte #  0.5  Monocyte #  0.1  Eosinophil # 0.0  Basophil # 0.0   Assessment and Plan: Impression:   Leukopenia, likely medication induced. Plan:   1.  Leukopenia: Patient's white count was normal upon admission therefore this is likely medication induced.  It could be a combination of her Imuran and methotrexate both which can cause significant immunosuppression. Plus, the effect may have been compounded with her dehydration and acute illness.  Ceftriaxone and pantoprazole also list leukopenia as a side effect, but this is relatively rare with only approximately 2% for each medication.  No intervention is needed at this time.  Patient does not require bone marrow biopsy at this time.  If her white blood cell count does not recover as her acute illness resolves, would consider one at that point.  Patient expressed understanding and   was in agreement with this plan.  consult, will follow.  Electronic Signatures: Finnegan, Timothy (MD)  (Signed 05-Sep-14 18:02)  Authored: HISTORY OF PRESENT ILLNESS, PFSH, ROS, NURSING NOTES, PE, ALLERGIES, HOME MEDICATIONS, LABS, ASSESSMENT AND PLAN   Last Updated: 05-Sep-14 18:02 by Finnegan, Timothy (MD) 

## 2015-01-06 NOTE — Consult Note (Signed)
PATIENT NAME:  Stephanie Alvarez, Avyonna MR#:  [id number] DATE OF BIRTH:  03/03/1935  DATE OF CONSULTATION:  05/25/2013  REFERRING PHYSICIAN:  Angelica Ranavid Hower, MD CONSULTING PHYSICIAN:  Ardeen FillersUzma S. Garnetta BuddyFaheem, MD  REASON FOR CONSULTATION: Depression.   HISTORY OF PRESENT ILLNESS: The patient is a 80 year old Caucasian female with a significant history of Crohn's disease and currently on azathioprine and methotrexate for the same as well as diabetes admitted due to 2-week duration of nausea, vomiting and diarrhea. She was unable to tolerate anything by mouth. At the time of admission, she describes her diarrhea as being watery and was having for 5 to 10 times daily. She also was having emesis which was nonbloody and nonbilious up to 5 times daily. She was having slight abdominal pain which was suprapubic. Upon arrival to Emergency Department, she was found to be hypotensive and required IV fluids hydration and also found to have a UTI and was started on antibiotics and was C. diff negative.   Psychiatric consult was placed as patient continued to be depressed and she stopped eating for the past couple of days. During my interview, her family members, including her mother and 2 daughters, were present in the room. They reported that the patient has actually started eating since yesterday. The family and the patient were considering hospice care and palliative care at this time. They reported that patient is not improving and she has been going downhill since her admission. They reported that the patient has been refusing care and has declined towards end of life unless she starts eating and participating in her care. The patient's daughters were asked about the PEG tube and the patient refused.  Her daughter also asked about hospice services which was discussed in detail.   During my interview, the patient reported that she is feeling very tired and she does not like to eat as she does not like the smell and taste of food. Her  daughter reported that the patient only ate 3 bites of food since this morning. She reported that she does not want to eat but is feeling very tired. Her daughter mentioned that she tried to walk with the help of PT this morning. Her mother, who is going to be 281 years old next week, was also present in the room. She also mentioned that the patient has been becoming progressively tired. She was living in the SNF  before coming to the hospital and they are trying to place her in the palliative care home after her discharge. Her daughter reported that she is trying to become the power of attorney for her with the help of the chaplain. They reported that they want the patient to start becoming active and participate actively in her care. She has also became DNR recently after discussion with her son and her daughter. The patient reported that she feels tired, hopeless and helpless, but the family and the patient agreed to start taking medications for depression.   PAST PSYCHIATRIC HISTORY: The patient reported that she has [facility] a [facility] the [facility]. She was tried on Paxil and Zoloft in the past. She is not taking any psychotropic medication at this time. She denied any history of previous suicide attempts.   MEDICAL HISTORY: Hypertension, Crohn's disease, diabetes mellitus type 2, gallbladder surgery, back surgery, total hysterectomy.   ALLERGIES: REMICADE.   CURRENT MEDICATIONS: Megace 40 mg p.o. b.i.d., azathioprine 150 mg p.o. daily, levofloxacin 500 mg IV piggyback, metoprolol 25 mg p.o.  b.i.d., nystatin topical powder applied to affected area twice a day, Protonix 40 mg in the morning, Zoloft 200 mg daily, insulin sliding scale, albuterol inhaler as needed, tiotropium HandiHaler 1 capsule daily.   ANCILLARY DATA: Temperature 97.6, pulse 114, respirations 18, blood pressure 86/52.   SOCIAL HISTORY: The patient denies any use of alcohol and drugs. She currently lives  at [facility] facility and requires assistance with majority of her ADLs.  Her family is very supportive.   MENTAL STATUS EXAMINATION: The patient is an older-looking female who appears older than her stated age. She maintained fair eye contact. Her speech was low in tone and volume. Mood was depressed and anxious. She appeared apprehensive during the interview. She denied having any suicidal or homicidal ideation or plans. Her memory appeared intact. No perceptual disturbances were noted. Her memory was intact as well.   CODE STATUS:  The patient is DO NOT RESUSCITATE at this time.   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   AXIS II:  None.  AXIS III: Please review the medical history.   TREATMENT PLAN 1.  I discussed with the patient as well as with her family at length about the medications, treatment, risks, benefits and alternatives.  2.  I will start her on Remeron 15 mg p.o. at bedtime.  3.  I will decrease the dose of the Zoloft 100 mg at bedtime.  4.  The patient will be monitored closely by the staff and please reconsult if you need any help with adjustment of her psychotropic medications. Remeron will help with her appetite as well as help her sleep.   The patient and her family agreed to the plan. Thank you for allowing me to participate in the care of this patient.     ____________________________ Ardeen Fillers. Garnetta Buddy, MD usf:cs D: 05/25/2013 14:04:26 ET T: 05/25/2013 14:35:55 ET JOB#: [id number]  cc: Ardeen Fillers. Garnetta Buddy, MD, <Dictator> Rhunette Croft MD ELECTRONICALLY SIGNED 05/27/2013 13:15

## 2015-01-06 NOTE — Consult Note (Signed)
Pt seen and examined. Full consult to follow. Pt known to me. Hx of Crohn's colitis. Persistent diarrhea despiter imuran, MTX, and biologic agent. [facility] this year. Told to have repeat EGD/colon but patient did not f/u. I saw her a month ago. I recommended EGD/colon again, but patient did not schedule either. Recently, more nausea/vomiting. Looks like patient has UTI contributing to overall problems. Feels better. Continue IV hydratiion/Abx. As patient's condition improves, we can try to schedule EGD/colon before she is discharged. Thanks.  Electronic Signatures: Lutricia Feilh, Delonna Ney (MD)  (Signed on 30-Aug-14 09:49)  Authored  Last Updated: 30-Aug-14 09:49 by Lutricia Feilh, Poetry Cerro (MD)

## 2015-01-06 NOTE — Consult Note (Signed)
   Comments   Josh Borders, NP, and I met with pt's son and 3 daughters. They are aware of finding of probable lung cancer on CT. We discussed in detail the options of aggressive work-up including bronch (if/when pt's respiratory status improves) vs comfort treatment. We then discussed the options of comfort care at SNF vs at [facility]. Family all in agreement that comfort care at the [place] is best choice for pt.  then spoke with pt in the presence of family. Reviewed all of above with her including probability of lung cancer. Pt does not want further aggressive work-up and chooses to go to the [facility]. Ginny Ward, RN, liason for [facility] notified and will see pt.  expressed appreciation for meeting. All questions answered.  Dx: Lung cancer   Secondary Dx: Post-obstructive pneumonia, pleural effusions, Crohn's diseaase  Electronic Signatures: Lumina Gitto, Izora Gala (MD)  (Signed 10-Sep-14 11:40)  Authored: Palliative Care   Last Updated: 10-Sep-14 11:40 by Abhiraj Dozal, Izora Gala (MD)

## 2015-01-06 NOTE — Consult Note (Signed)
PATIENT NAME:  Stephanie Alvarez, Stephanie Alvarez MR#:  [id number] DATE OF BIRTH:  Jan 18, 1935  DATE OF CONSULTATION:  05/15/2013  REASON FOR REFERRAL: Nausea, vomiting, and diarrhea.   DESCRIPTION: The patient is a 80 year old white female with a known history of Crohn's disease who I was asked to evaluate because of worsening nausea, vomiting and diarrhea. The patient is well-known to me. She does have a known history of Crohn's colitis who has tried various biological agents without significant relief. I saw her in February 2014. Because of the persistent symptoms despite being on immunomodulators and biological agents, I referred her to [facility] for further evaluation.   She had a colonoscopy last year which did not show significant colitis. [facility] felt that the chronic diarrhea was probably not related to Crohn's disease, but something else. She was told to schedule an upper endoscopy and colonoscopy there. Unfortunately, the patient did not follow up due to distance. Then, because of her limited mobility she was sent to a nursing facility in May of this year.    I then saw her on July 24th. At the time the patient continued to have diarrhea, usually 5 to  6 loose stools per day. This was just despite taking 6 Lomotils per day and upto 4 imodiums per day, and 150 mg daily of Bentyl p.r.n. It appears that her symptoms were getting worse. She was then diagnosed with a urinary tract infection, was given some antibiotics.  She was also started on Diflucan then for unknown reasons.   I got a call from her primary doctor, Dr. Quillian Quince, about a week ago about her overall condition. I told her to contact Ms Ollis about getting the upper endoscopy and colonoscopy scheduled,  which she had not, but apparently she started to have worsening diarrhea where it getting  5 to 10 times a day despite all the medicines. There was no bleeding. She started having some and nausea and vomiting to the point that she could not tolerate anything by  mouth. She was also having some dysuria as well, without any fevers. There was abdominal discomfort. She was then brought into the Emergency Room for evaluation where she was found to be hypotensive. She was found to have a UTI, at least by urine. She was started on antibiotics even though C. diff. was checked and was negative.   REVIEW OF SYMPTOMS: Please refer to the review of symptoms that was dictated on admission on July 29th. There are no changes.   PAST MEDICAL HISTORY: She has been diagnosed with Crohn's disease since 2004. She has type 2 diabetes, hypertension, some generalized anxiety disorder.   PAST SURGICAL HISTORY: Noted for distal radial and ulna fracture repairs.   ALLERGIES: SHE IS ALLERGIC TO REMICADE AND LATEX.   FAMILY HISTORY: Notable for coronary artery disease.   SOCIAL HISTORY: There is no alcohol, tobacco. She currently lives at [facility].   MEDICATIONS AT HOME INCLUDE: Azathioprine 200 mg daily, which I started on July 24th. She also takes dicyclomine 10 mg twice a day, hydrochlorothiazide, Imodium and Lomotil. She also takes magnesium oxide, metformin 500 mg twice a day, methotrexate injections on Wednesday, metoprolol 100 mg twice a day, multivitamins, Norco p.r.n., insulin in the morning, Prilosec daily, calcium, quinapril 40 mg daily, sertraline 100 mg twice a day, Zyrtec and Tylenol.   The patient feels a lot better today. She feels stronger with IV hydration and antibiotics. The nausea is improving as well.   VITAL SIGNS: Are stable. She is  afebrile.  HEAD AND NECK EXAM: Show a normocephalic, atraumatic head. Pupils are equally reactive. Throat is clear.  NECK: Supple.  CARDIAC: Regular rhythm and rate. Without murmurs.  LUNGS: Clear bilaterally.  ABDOMEN: Showed normoactive bowel sounds, soft. There is some mild tenderness in the lower abdomen. There is no hepatomegaly. She has active bowel sounds.  EXTREMITIES: Showed no clubbing, cyanosis, or edema.    In terms of labs, for August 30th sodium was 137, potassium 3.1, creatinine 1.05, BUN is 43, chloride 105, CO2 21, troponin level is slightly elevated at 0.40, white count is 4.6, hemoglobin 9.4.   ASSESSMENT AND PLAN: This is a patient with known history of Crohn's colitis who has not responded well to various medications for Crohn's. It appears that she has a urinary tract infection contributing to the overall symptoms of diarrhea and nausea. She is feeling better. We will recommend continued IV hydration, antibiotics. As her condition improves, we will try to schedule an upper endoscopy and colonoscopy before she is discharged.   Thank you for the referral.      ____________________________ Ezzard StandingPaul Y. Bluford Kaufmannh, MD pyo:dm D: 05/16/2013 09:16:08 ET T: 05/16/2013 10:35:32 ET JOB#: [id number]  cc: Ezzard StandingPaul Y. Bluford Kaufmannh, MD, <Dictator> Ezzard StandingPAUL Y Wesam Gearhart MD ELECTRONICALLY SIGNED 05/19/2013 8:36

## 2015-01-06 NOTE — Consult Note (Signed)
Chief Complaint:  Subjective/Chief Complaint Still nauseous. Appetite poor. Oral intake minimal.   VITAL SIGNS/ANCILLARY NOTES: **Vital Signs.:   05-Sep-14 07:58  Vital Signs Type Routine  Temperature Temperature (F) 97.8  Celsius 36.5  Temperature Source oral  Pulse Pulse 100  Respirations Respirations 21  Systolic BP Systolic BP 643  Diastolic BP (mmHg) Diastolic BP (mmHg) 84  Mean BP 105  Pulse Ox % Pulse Ox % 100  Pulse Ox Activity Level  At rest  Oxygen Delivery 2L   Brief Assessment:  GEN no acute distress   Cardiac Regular   Respiratory normal resp effort   Lab Results: Routine Chem:  05-Sep-14 11:54   Glucose, Serum  154  BUN  3  Creatinine (comp) 0.69  Sodium, Serum 137  Potassium, Serum  2.6  Chloride, Serum 102  CO2, Serum 26  Calcium (Total), Serum  7.3  Anion Gap 9  Osmolality (calc) 273  eGFR (African American) >60  eGFR (Non-African American) >60 (eGFR values <64m/min/1.73 m2 may be an indication of chronic kidney disease (CKD). Calculated eGFR is useful in patients with stable renal function. The eGFR calculation will not be reliable in acutely ill patients when serum creatinine is changing rapidly. It is not useful in  patients on dialysis. The eGFR calculation may not be applicable to patients at the low and high extremes of body sizes, pregnant women, and vegetarians.)  Result Comment WBC - RESULTS VERIFIED BY REPEAT TESTING.  - NOTIFIED OF CRITICAL VALUE  - C/CRYSTAL MINTER AT 1219 05/21/13-LAB  - READ-BACK PROCESS PERFORMED.  Result(s) reported on 21 May 2013 at 12:29PM.  Routine Hem:  05-Sep-14 11:54   WBC (CBC)  1.5  RBC (CBC)  2.41  Hemoglobin (CBC)  8.3  Hematocrit (CBC)  23.5  Platelet Count (CBC)  73  MCV 97  MCH  34.2  MCHC 35.2  RDW  20.2  Neutrophil % 53.7  Lymphocyte % 34.5  Monocyte % 8.3  Eosinophil % 2.9  Basophil % 0.6  Neutrophil #  0.8  Lymphocyte #  0.5  Monocyte #  0.1  Eosinophil # 0.0  Basophil # 0.0    Assessment/Plan:  Assessment/Plan:  Assessment UTI- treated. Nausea persists. Diarrhea better. Both EGD and colon relatively normal. Biopsies pending.   Plan Encourage oral intake over the weekend. Consider nutritional supplements, i.e. ensure, etc. Have nutrition involved. Moniter WBC. If remains low, then may have to hold imuran again. Dr. RRayann Hemanto cover over the weekend. THanks.   Electronic Signatures: OVerdie Shire(MD)  (Signed 05-Sep-14 13:12)  Authored: Chief Complaint, VITAL SIGNS/ANCILLARY NOTES, Brief Assessment, Lab Results, Assessment/Plan   Last Updated: 05-Sep-14 13:12 by OVerdie Shire(MD)

## 2015-01-06 NOTE — Consult Note (Signed)
Patient and family reportedly are considering hospice care.  No further Hematology input is necessary at this point.  Please reconsult if status or situation changes.  Will sign off for now.  Appreciate consult.  Electronic Signatures: Gerarda FractionFinnegan, Harshika Mago (MD)  (Signed on 09-Sep-14 09:29)  Authored  Last Updated: 09-Sep-14 09:29 by Gerarda FractionFinnegan, Roth Ress (MD)

## 2015-01-06 NOTE — Consult Note (Signed)
Chief Complaint:  Subjective/Chief Complaint Appetite remains low. C/O SOB. CXR showed pneumonia. Started megace, which patient does not like the taste of. Imuran on hold due to leukopenia. Mild abd pain. Diarrhea better. Averaging 3 BM's per day now.   VITAL SIGNS/ANCILLARY NOTES: **Vital Signs.:   08-Sep-14 07:49  Vital Signs Type Routine  Temperature Temperature (F) 97.6  Celsius 36.4  Temperature Source oral  Pulse Pulse 103  Respirations Respirations 24  Systolic BP Systolic BP 885  Diastolic BP (mmHg) Diastolic BP (mmHg) 90  Mean BP 110  Pulse Ox % Pulse Ox % 99  Pulse Ox Activity Level  At rest  Oxygen Delivery 2L; Nasal Cannula   Brief Assessment:  GEN no acute distress   Cardiac Regular   Respiratory clear BS   Gastrointestinal Normal   Lab Results: Routine Chem:  07-Sep-14 10:55   Glucose, Serum  183  BUN  3  Creatinine (comp)  0.55  Sodium, Serum 139  Potassium, Serum  3.2  Chloride, Serum 106  CO2, Serum 26  Calcium (Total), Serum  7.3  Anion Gap 7  Osmolality (calc) 279  eGFR (African American) >60  eGFR (Non-African American) >60 (eGFR values <50m/min/1.73 m2 may be an indication of chronic kidney disease (CKD). Calculated eGFR is useful in patients with stable renal function. The eGFR calculation will not be reliable in acutely ill patients when serum creatinine is changing rapidly. It is not useful in  patients on dialysis. The eGFR calculation may not be applicable to patients at the low and high extremes of body sizes, pregnant women, and vegetarians.)  Result Comment WBC - RESULTS VERIFIED BY REPEAT TESTING.  - CRITICAL VALUE PREVIOUSLY NOTIFIED.  Result(s) reported on 23 May 2013 at 11:33AM.  Routine Hem:  07-Sep-14 10:55   WBC (CBC)  1.4  RBC (CBC)  2.75  Hemoglobin (CBC)  9.4  Hematocrit (CBC)  27.1  Platelet Count (CBC)  135  MCV 98  MCH  34.2  MCHC 34.8  RDW  20.6  Neutrophil % 33.4  Lymphocyte % 41.1  Monocyte % 21.6   Eosinophil % 2.7  Basophil % 1.2  Neutrophil #  0.5  Lymphocyte #  0.6  Monocyte # 0.3  Eosinophil # 0.0  Basophil # 0.0   Radiology Results: XRay:    07-Sep-14 18:18, Chest Portable Single View  Chest Portable Single View   REASON FOR EXAM:    dyspnea  COMMENTS:       PROCEDURE: DXR - DXR PORTABLE CHEST SINGLE VIEW  - May 23 2013  6:18PM     RESULT: Comparison is made to the study of May 14, 2013.     The right lung is well-expanded. Increased interstitial density isnoted   in the right infrahilar region. There is abnormal density in the left   hemithorax that may reflect left upper lobe collapse that has occurred   since the previous study. A known left hilar mass or infiltrate is   present. The cardiac silhouette is not enlarged. There is no mediastinal   shift. The left hemidiaphragm is obscured.    IMPRESSION:  On the left upper lobe collapse has developed with increased     density now throughout much of the upper lobe. On the right there is new   increased interstitial density in the perihilar and infrahilar regions   which may reflect subsegmental atelectasis. A followup PA and lateral   chest x-ray or chest CT scan would be of value.  Dictation Site: 1        Verified By: DAVID A. Martinique, M.D., MD   Assessment/Plan:  Assessment/Plan:  Assessment Hx of Crohn's. Bx's pending. Pneumonia.   Plan Continue current meds. Encourage oral intake. Pt agreeable to psych consult. I will be out tomorrow in [place]. Will check back on Wed.   Electronic Signatures: Verdie Shire (MD)  (Signed 08-Sep-14 13:06)  Authored: Chief Complaint, VITAL SIGNS/ANCILLARY NOTES, Brief Assessment, Lab Results, Radiology Results, Assessment/Plan   Last Updated: 08-Sep-14 13:06 by Verdie Shire (MD)

## 2015-01-06 NOTE — Consult Note (Signed)
   Comments   Met with pt's daughter and son. Daughter says she recognizes a signifcant decline over the past several month. She understands that patient is not eating and refusing care and that this may herald a decline towards end of life unless patient begins eating and participating in her care. Daughter asked pt if she wanted a PEG and patient refused. Daughter asked about hospice services which we discussed in detail. I suggested that patient could have hospice either at SNF or the hospice home. However, son did not seem to like this idea and asked that we wait to have a meeting in a few days when the rest of his siblings arrive.  discussed code status. Both daughter and son agree with DNR.  plan a family meeting when all of pt's children are here. Will have psychiatry see for depression management. Plan continuation of supportive care until further decisions are made.  Electronic Signatures: Librado Guandique, Kirt Boys (NP)  (Signed 08-Sep-14 17:59)  Authored: Palliative Care Phifer, Izora Gala (MD)  (Signed 08-Sep-14 21:31)  Authored: Palliative Care   Last Updated: 08-Sep-14 21:31 by Phifer, Izora Gala (MD)

## 2015-01-06 NOTE — Consult Note (Signed)
Patient's white blood cell count and platelet count slightly decreased today.  The most likely scenario is still medication induced, but if her counts do not improve in the next 3-4 days, will consider bone marrow biopsy.  Will order HIT labs and antineutrophil antibodies for completeness. continue to follow.  Electronic Signatures: Delight Hoh (MD)  (Signed on 06-Sep-14 10:34)  Authored  Last Updated: 06-Sep-14 10:34 by Delight Hoh (MD)

## 2015-01-06 NOTE — Consult Note (Signed)
Both EGD and colonoscopy done. EGD and colon to TI essentially normal. Bx's taken from stomach, duodenum, ileum, and colon throughout. Mechanical diet ordered. Resume imuran at 150mg  daily. Thanks.  Electronic Signatures: Lutricia Feilh, Miklos Bidinger (MD)  (Signed on 04-Sep-14 15:04)  Authored  Last Updated: 04-Sep-14 15:04 by Lutricia Feilh, Stevana Dufner (MD)

## 2015-01-06 NOTE — Consult Note (Signed)
Chief Complaint:  Subjective/Chief Complaint Feeling better but still nauseous. To be transferred to floor.   VITAL SIGNS/ANCILLARY NOTES: **Vital Signs.:   31-Aug-14 10:00  Vital Signs Type Routine  Pulse Pulse 80  Pulse source if not from Vital Sign Device per cardiac monitor  Respirations Respirations 18  Systolic BP Systolic BP 677  Diastolic BP (mmHg) Diastolic BP (mmHg) 58  Mean BP 77  Pulse Ox % Pulse Ox % 100  Oxygen Delivery 2L; Nasal Cannula  Pulse Ox Heart Rate 80   Brief Assessment:  GEN no acute distress   Cardiac Regular   Respiratory clear BS   Lab Results: Routine Chem:  31-Aug-14 02:55   Magnesium, Serum 2.1 (1.8-2.4 THERAPEUTIC RANGE: 4-7 mg/dL TOXIC: > 10 mg/dL  -----------------------)  Glucose, Serum  111  BUN  25  Creatinine (comp) 0.86  Sodium, Serum 137  Potassium, Serum 3.6  Chloride, Serum 105  CO2, Serum 22  Calcium (Total), Serum  7.9  Anion Gap 10  Osmolality (calc) 279  eGFR (African American) >60  eGFR (Non-African American) >60 (eGFR values <59m/min/1.73 m2 may be an indication of chronic kidney disease (CKD). Calculated eGFR is useful in patients with stable renal function. The eGFR calculation will not be reliable in acutely ill patients when serum creatinine is changing rapidly. It is not useful in  patients on dialysis. The eGFR calculation may not be applicable to patients at the low and high extremes of body sizes, pregnant women, and vegetarians.)  Routine Hem:  31-Aug-14 02:55   WBC (CBC)  3.4  RBC (CBC)  2.43  Hemoglobin (CBC)  8.4  Hematocrit (CBC)  24.2  Platelet Count (CBC) 189  MCV 100  MCH  34.6  MCHC 34.7  RDW  20.9  Neutrophil % 79.5  Lymphocyte % 18.2  Monocyte % 0.6  Eosinophil % 1.2  Basophil % 0.5  Neutrophil # 2.7  Lymphocyte #  0.6  Monocyte #  0.0  Eosinophil # 0.0  Basophil # 0.0 (Result(s) reported on 16 May 2013 at 03:24AM.)   Assessment/Plan:  Assessment/Plan:  Assessment UTI.  N/V/D. Overall symptoms are improving but still with nausea.   Plan Discussed scheduling EGD/colon tomorrow but patient does not feel she can tolerate the bowel prep. Will plan once nausea is much better. Thanks.   Electronic Signatures: OVerdie Shire(MD)  (Signed 31-Aug-14 10:09)  Authored: Chief Complaint, VITAL SIGNS/ANCILLARY NOTES, Brief Assessment, Lab Results, Assessment/Plan   Last Updated: 31-Aug-14 10:09 by OVerdie Shire(MD)

## 2015-01-06 NOTE — Discharge Summary (Signed)
PATIENT NAME:  Stephanie Alvarez, Analaya MR#:  [id number] DATE OF BIRTH:  26-May-1935  DATE OF ADMISSION:  05/14/2013 DATE OF DISCHARGE:  05/27/2013  DISPOSITION: To hospice home.   DISCHARGE DIAGNOSES: For the hospice home: Lung mass with possible malignancy, failure to thrive, chronic obstructive pulmonary disease, cardiomyopathy, respiratory failure, pneumonia.   CODE STATUS: DNR.   MEDICATIONS: Zoloft 100 mg 2 tablets daily, Imodium as needed for diarrhea, morphine  0.25 to 0.5 mL, 20 mg per mL every 1 to 2 hours for pain and dyspnea, Ativan, 0.5 mg 1 to  2 tablets daily every 2 to 4 hours as needed for anxiety and mirtazapine 15 mg at bedtime.   CONSULTATIONS: Palliative care consult, Dr. Harvie JuniorPhifer; pulmonary consult with Dr. Belia HemanKasa; GI consult with Dr. Bluford Kaufmannh; nutrition consult and oncology consult with Dr. Orlie DakinFinnegan, physical therapy consult, psychiatric consult with Dr. Garnetta BuddyFaheem, cardiology consult with Dr. Darrold JunkerParaschos.   HOSPITAL COURSE: A 80 year old female patient with a history of Crohn's disease on azathioprine and methotrexate, history of diabetes insulin-requiring, hypertension, came in because of nausea, vomiting and diarrhea, and the patient had very poor p.o. intake. The diarrhea has been going on now for about 2 weeks, and the patient was also hypotensive on admission. The systolic blood pressure was in the 70s. Admitted to the ICU for sepsis, likely source intraabdominal, and the patient was started on Zosyn and blood cultures, urine cultures and C. diff. were sent. The  patient continued to have diarrhea and nausea, and the patient's stool for C. diff. has been negative and blood cultures and also Campylobacter is negative, and (stool cultures negative The patient had a CT of the abdomen which showed several severe residual thickening in the bladder wall, and also coronary artery disease, cystitis   Diabetes: Hold metformin. Continue sliding-scale as per her p.o. 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   The patient did have an E. coli UTI, and she was on Rocephin, and she did have an EGD and colonoscopy for workup of her diarrhea and also nausea. The patient's EGD did not show any esophagitis or duodenitis or ulcer disease and it was essentially normal. She also had a colonoscopy which did not show any inflammation of the terminal ileum to suggest she had had a Crohn's flare. The patient was started back on the Imuran by GI, the patient continued to have nausea and diarrhea resolved, but she persistently had nausea. The patient was started on Reglan, Phenergan and also started on some appetite stimulant, Megace. At this time, the patient still has a poor appetite. The patient was found to have granulocytopenia secondary to Imuran and methotrexate. Seen by Dr. Orlie DakinFinnegan on admission. The patient's white count was normal at 3.8, and hemoglobin 11.3, platelets 309.  On admission, the white count was 7.6, hemoglobin 11.9, hematocrit 34.2, platelets 395. However, the white count dropped to 3.4 and then continued to drop to 2.7 with a drop in her platelets, also with a drop in the overall neutrophil count, lymphocyte count and monocytes count suggesting that granulocytopenia secondary to possibly secondary to Imuran and also methotrexate, so we have stopped those medications and the patient was kept under neutropenic precautions because of the possibility of infection. The patient had shortness of breath and a chest x-ray was done which showed likely a possible pneumonia. The patient had poor IV access. We got a PICC line in. Dr. Belia HemanKasa has  seen the patient because of possible right lung collapse on x-ray of the chest, so she had a CT of the chest which was done and it was concerning for a malignancy. CT chest showed left upper lobe atelectasis, possibly central obstructing  mass, and the patient was seen by palliative care because the patient has been dealing with this nausea, vomiting with poor p.o. intake for at most, 1 to 2 months. Family wanted to get an opinion from Dr. Harvie Junior. Dr. Harvie Junior saw the patient. Discussed that the patient may be suffering from depression as well with the loss of her husband. The patient was seen by psychiatry, Dr. Garnetta Buddy, and the patient was started on Remeron for depression, and Zoloft was decreased to 200 and the patient was still very depressed with poor p.o. intake and still had nausea. With her cancer diagnosis, the patient's family thought it was best for her to have comfort care at the hospice home. The patient is going to hospice home today.   ADDENDUM: The patient's troponins were slightly elevated. Echocardiogram showed EF of more than 55%. The patient had no chest pain and troponin was 0.41 and 0.40. Echocardiogram showed EF of 45% to 50% with low-normal systolic function with LVH.   Essentially, the patient's hospital course was significant for non-ST elevation MI, hyponatremia due to diarrhea, dehydration improved  hypertension, stable.  Diabetes, hold metformin, continue sliding scale as per p.o. intake     now with granulocytopenia secondary to Imuran and methotrexate,  depression and possible lung cancer with postobstructive pneumonia, failure to thrive. diasharge to hospice home  Time spent on discharge preparation:   More than 30 minutes.  ____________________________ Katha Hamming, MD sk:dm D: 05/27/2013 10:44:00 ET T: 05/27/2013 11:15:27 ET JOB#: [id number]  cc: Ezzard Standing. Bluford Kaufmann, MD Tollie Pizza. Orlie Dakin, MD Marcina Millard, MD L. Clayborn Bigness, MD Katha Hamming, MD, <Dictator>   Katha Hamming MD ELECTRONICALLY SIGNED 06/14/2013 4:48

## 2015-01-06 NOTE — Consult Note (Signed)
Chief Complaint:  Subjective/Chief Complaint Only drank 1 cup of golytely yest. Not given miralax/gatorade until now. Nausea less. EGD/colon postponed until tomorrow.   VITAL SIGNS/ANCILLARY NOTES: **Vital Signs.:   03-Sep-14 09:57  Vital Signs Type Routine  Temperature Temperature (F) 98  Celsius 36.6  Pulse Pulse 97  Systolic BP Systolic BP 142  Diastolic BP (mmHg) Diastolic BP (mmHg) 85  Mean BP 104  Pulse Ox % Pulse Ox % 94   Brief Assessment:  GEN no acute distress   Cardiac Regular   Respiratory clear BS   Gastrointestinal Normal   Lab Results: Routine Chem:  02-Sep-14 08:35   Result Comment PLATELET COUNT - SLIGHT PLATELET CLUMPING IN SPECIMEN. ACTUAL  - NUMERICAL COUNT MAY BE SOMEWHAT HIGHER THAN  - THE REPORTED VALUE.  Result(s) reported on 18 May 2013 at 09:23AM.  Routine Hem:  02-Sep-14 08:35   WBC (CBC)  2.7  RBC (CBC)  2.62  Hemoglobin (CBC)  9.1  Hematocrit (CBC)  25.7  Platelet Count (CBC) 165  MCV 98  MCH  34.6  MCHC 35.2  RDW  19.9  Neutrophil % 72.8  Lymphocyte % 25.2  Monocyte % 0.9  Eosinophil % 0.8  Basophil % 0.3  Neutrophil # 2.0  Lymphocyte #  0.7  Monocyte #  0.0  Eosinophil # 0.0  Basophil # 0.0  Bands -  Segmented Neutrophils -  Lymphocytes -  Variant Lymphocytes -  Monocytes -  Eosinophil -  Basophil -  Metamyelocyte -  Myelocyte -  Promyelocyte -  Blast-Like -  Other Cells -  NRBC -  Diff Comment 1 -  Diff Comment 2 -  Diff Comment 3 -  Diff Comment 4 -  Diff Comment 5 -  Diff Comment 6 -  Diff Comment 7 -  Diff Comment 8 -  Diff Comment 9 -  Diff Comment 10 - (Result(s) reported on 18 May 2013 at 07:45AM.)   Assessment/Plan:  Assessment/Plan:  Assessment N/V/Abd pain/Diarrhea. Hx of Crohn's. UTI- Overall better.   Plan For EGD/colon tomorrow afternoon. Thanks.   Electronic Signatures: Lutricia Feilh, Haila Dena (MD)  (Signed 03-Sep-14 12:36)  Authored: Chief Complaint, VITAL SIGNS/ANCILLARY NOTES, Brief Assessment, Lab  Results, Assessment/Plan   Last Updated: 03-Sep-14 12:36 by Lutricia Feilh, Montez Cuda (MD)

## 2015-01-06 NOTE — Consult Note (Signed)
Chief Complaint:  Subjective/Chief Complaint Feels slightly worse than yesterday. Still with nausea. Diarrhea improving.   VITAL SIGNS/ANCILLARY NOTES: **Vital Signs.:   01-Sep-14 07:55  Vital Signs Type Routine  Temperature Temperature (F) 97.4  Celsius 36.3  Temperature Source oral  Pulse Pulse 92  Respirations Respirations 20  Systolic BP Systolic BP 160  Diastolic BP (mmHg) Diastolic BP (mmHg) 62  Mean BP 75  Pulse Ox % Pulse Ox % 99  Pulse Ox Activity Level  At rest  Oxygen Delivery 1L; Nasal Cannula   Brief Assessment:  GEN no acute distress   Cardiac Regular   Respiratory clear BS   Gastrointestinal Normal   Lab Results:  Routine Chem:  01-Sep-14 05:05   Glucose, Serum  117  BUN 14  Creatinine (comp) 0.89  Sodium, Serum 138  Potassium, Serum 3.6  Chloride, Serum  108  CO2, Serum 22  Calcium (Total), Serum 8.5  Anion Gap 8  Osmolality (calc) 277  eGFR (African American) >60  eGFR (Non-African American) >60 (eGFR values <32m/min/1.73 m2 may be an indication of chronic kidney disease (CKD). Calculated eGFR is useful in patients with stable renal function. The eGFR calculation will not be reliable in acutely ill patients when serum creatinine is changing rapidly. It is not useful in  patients on dialysis. The eGFR calculation may not be applicable to patients at the low and high extremes of body sizes, pregnant women, and vegetarians.)  Routine Hem:  01-Sep-14 05:05   WBC (CBC)  2.8  RBC (CBC)  2.47  Hemoglobin (CBC)  8.7  Hematocrit (CBC)  24.4  Platelet Count (CBC) 172  MCV 99  MCH  35.1  MCHC 35.5  RDW  19.8  Neutrophil % 74.3  Lymphocyte % 23.1  Monocyte % 0.5  Eosinophil % 1.6  Basophil % 0.5  Neutrophil # 2.1  Lymphocyte #  0.7  Monocyte #  0.0  Eosinophil # 0.0  Basophil # 0.0 (Result(s) reported on 17 May 2013 at 05:30AM.)   Assessment/Plan:  Assessment/Plan:  Assessment C.diff. UTI.   Plan Continue current meds.Will plan  bowel prep tomorrow afternoon. Hopefully, she can tolerate it. Tentatively plan EGD/colon on Wed afternoon. I will be out tomorrow. If there are GI concerns, then contact GI on call. Otherwise, will check back on Wed. Thanks   Electronic Signatures: OVerdie Shire(MD)  (Signed 01-Sep-14 09:58)  Authored: Chief Complaint, VITAL SIGNS/ANCILLARY NOTES, Brief Assessment, Lab Results, Assessment/Plan   Last Updated: 01-Sep-14 09:58 by OVerdie Shire(MD)

## 2015-01-06 NOTE — H&P (Signed)
PATIENT NAME:  Stephanie Alvarez, PARO MR#:  [id number] DATE OF BIRTH:  [birth year]  DATE OF ADMISSION:  05/14/2013  PRIMARY CARE PHYSICIAN:  Dr. Quillian Quince.  REFERRING PHYSICIAN:  Dr. Fanny Bien.    HISTORY OF PRESENT ILLNESS:  Ms. Previti is a 80 year old Caucasian female with past medical history of Crohn's disease on azathioprine and methotrexate for immunomodulation, diabetes, which is insulin-requiring, hypertension.  She is presenting for two week duration of nausea, vomiting and diarrhea, which has been worsened from where she is unable to tolerate by mouth.  She describes her diarrhea as watery, 5 to 10 times daily, without worsening or relieving factors.  She denies any blood or mucus.  Her emesis is described as nonbloody and nonbilious, up to 5 times daily.  She is now unable to tolerate by mouth intake.  Also, she mentions dysuria for 2 to 3 day duration without fevers or chills.  She does mention having slight abdominal pain which is suprapubic in nature without radiation.  Intensity ranged between 3 and 4 out of 10.  No worsening or relieving factors.  Upon arrival to the Emergency Department she is found to be hypotensive with lowest systolic blood pressure into the 70s.  She required IV fluid hydration, which she has responded with systolic blood pressures peaking in the mid to low 90s.  She has been found to have a UTI on laboratory data and started on antibiotics and she has been C. diff. negative.   REVIEW OF SYSTEMS:  CONSTITUTIONAL:  She attests to generalized fatigue and weakness secondary to decreased by mouth intake.  EYES:  She has no vision problems.  EARS, NOSE, THROAT AND MOUTH:  Denies any oral lesions.  CARDIOVASCULAR:  Denies palpitations or chest pain.  RESPIRATORY:  Denies any shortness of breath or cough.  GASTROINTESTINAL:  Once again attests to nausea, vomiting, diarrhea, as mentioned above.  GENITOURINARY:  The dysuria as mentioned above with increased urinary frequency.   MUSCULOSKELETAL:  Denies any pain or weakness.  SKIN:  Denies any lesions or rashes.  NEUROLOGIC:  No paresthesias or paralysis.  PSYCHIATRIC:  No depressive symptoms.  No homicidal or suicidal ideations.  ENDOCRINE:  No active issues.  HEMATOLOGY AND LYMPHATIC:  No easy bruisability or bleeding.  ALLERGY AND IMMUNOLOGY:  No active issues.  Otherwise a full review of systems performed by me is negative.   PAST MEDICAL HISTORY:  Crohn's disease diagnosed in 2004, hypertension, type 2 diabetes requiring insulin, macular degeneration as well as generalized anxiety disorder, not otherwise specified.   PAST SURGICAL HISTORY:  Open reduction internal fixation of left distal radial and ulnar fractures.   ALLERGIES:  REMICADE AS WELL AS LATEX.   FAMILY HISTORY:  For coronary artery disease in her father.   SOCIAL HISTORY:  Denies any alcohol or current tobacco usage.  She currently lives at [facility] facility and requires assistance for the majority of her activities of daily living.   HOME MEDICATIONS:  Include azathioprine 200 mg by mouth daily, dicyclomine 10 mg by mouth twice daily as an antispasmodic, hydrochlorothiazide 25 mg by mouth daily, Imodium 1 tablet after each loose stool every three hours as needed, Lomotil 0.025/2.5 mg 2 tabs twice daily, mag ox 400 mg by mouth twice daily, metformin 500 mg by mouth twice daily, methotrexate injection 25 mg once weekly on Wednesdays, metoprolol 100 mg by mouth twice daily, multivitamin 2 tabs by mouth daily, Norco 325/5 mg by mouth as needed for pain q. 4 to 6  hours, Novolin insulin 15 units subQ in the morning, NovoLog 5 units twice daily, Prilosec 20 mg by mouth daily, calcium 500 1 tab twice daily, quinapril 40 mg by mouth daily, sertraline 100 mg by mouth twice daily, Tylenol 500 mg 2 tabs twice daily for pain, Zyrtec 10 mg by mouth daily.   PHYSICAL EXAMINATION: VITAL SIGNS:  Temperature 97.6, pulse 106, respirations 20, blood  pressure 90/56, pulse ox 97% on room air.  GENERAL:  No acute distress, awake and alert and oriented x 3.  HEENT:  Normocephalic, atraumatic.  Extraocular muscles intact.  Pupils equal, round and reactive to light as well as accommodation.  Dry mucosal membranes.  No lymphadenopathy.  CARDIOVASCULAR:  S1 and S2, tachycardic.  No murmurs, rubs or gallops.  PULMONARY:  Clear to auscultation bilaterally without wheezes, rubs and rhonchi.  ABDOMEN:  Soft, nondistended, mild tenderness to palpation over the suprapubic region without rebound, guarding or motion tenderness.  She has hypoactive bowel sounds.  EXTREMITIES:  Reveal no cyanosis, edema or clubbing.  NEUROLOGIC:  Cranial nerves II through XII intact.  No gross neurological deficits.   LABORATORY DATA:  Sodium 132, potassium 3.9, chloride 97, bicarbonate 26, BUN 51, creatinine 1.19, glucose 153.  WBC 7.6 with hemoglobin 11.9 and platelets 395.  C. diff. negative.  Urinalysis, 3+ positive bacteria, 2+ positive leukocyte esterase, nitrate positive, lactic acid at 1.1.    ASSESSMENT AND PLAN:  This is a 80 year old female with past history of Crohn's disease on azathioprine and methotrexate for immunomodulation as well as diabetes requiring insulin therapy, hypertension, is presenting for a two week duration of nausea, vomiting, diarrhea, which had been worsened to the point where she is unable to tolerate by mouth.   1.  Sepsis, likely urinary in nature versus intra-abdominal.  She meets sepsis criteria by vital signs including tachycardia and tachypnea as well as confirmed urinary tract infection.  Severe sepsis is attributed to her hypotension.  Broad-spectrum antibiotics which is Zosyn for gram-positive, negative as well as anaerobic coverage.  She has been pancultured including blood, urine.  Stools has been negative for C. diff. and checking a fecal leukocyte.  IV fluids to maintain mean arterial blood pressure greater than 65.  She has responded  appropriately to IV fluids thus far.  She may require pressor therapy if she declines.   2.  New onset atrial fibrillation.  This was noted on her EKG with atrial fibrillation and a heart rate of 110, currently her heart rate is well-controlled.  We will avoid any rate-controlling agents at this time as it is unnecessary.  If heart rate does increase we will likely have to use digoxin given her hypotensive state unless her blood pressure improves, then we can use Cardizem.  We will avoid anticoagulation for now as from ER documentation there was trace positive heme stool on rectal exam and she is mildly anemic. checking cardiac enzymes, echo, and tsh  3.  Hyponatremia in the setting of emesis, diarrhea and decreased by mouth intake.  Continue IV fluid hydration.   4.  Hypertension.  We will hold all antihypertensive medications at this time as patient is currently hypotensive.  5.  Diabetes, insulin requiring.  Hold metformin as well as her basal insulin, place on insulin sliding scale with goal blood glucose between 120 and 180.  6.  Crohn's disease.  Hold her immunomodulators including methotrexate and azathioprine.   7.  CODE STATUS:  SHE IS A FULL CODE.  TOTAL TIME SPENT:  critical care time 37 minutes.    ____________________________ Cletis Athensavid K. Jermal Dismuke, MD dkh:ea D: 05/14/2013 22:12:51 ET T: 05/14/2013 23:19:53 ET JOB#: [id number]  cc: Cletis Athensavid K. Pier Bosher, MD, <Dictator> Larie Mathes Synetta ShadowK Ica Daye MD ELECTRONICALLY SIGNED 05/15/2013 2:02

## 2015-01-06 NOTE — Consult Note (Signed)
Chief Complaint:  Subjective/Chief Complaint Results of last 24 hrs noted. Pt c/o some SOB. Prob lung cancer. Requested to go to hospice. GI complaints minimal at this time. No signs of Crohn's or colitis on colon bx. EGD bx neg.   VITAL SIGNS/ANCILLARY NOTES: **Vital Signs.:   10-Sep-14 15:38  Vital Signs Type Routine  Temperature Temperature (F) 97.4  Celsius 36.3  Temperature Source oral  Pulse Pulse 76  Respirations Respirations 18  Systolic BP Systolic BP 150  Diastolic BP (mmHg) Diastolic BP (mmHg) 85  Mean BP 106  Pulse Ox % Pulse Ox % 93  Pulse Ox Activity Level  At rest  Oxygen Delivery 2L   Brief Assessment:  GEN no acute distress   Cardiac Regular   Respiratory clear BS   Gastrointestinal Normal   Lab Results: Routine Chem:  09-Sep-14 07:43   Result Comment WBC - RESULTS VERIFIED BY REPEAT TESTING.  - CRITICAL VALUE PREVIOUSLY NOTIFIED.  Result(s) reported on 25 May 2013 at 11:07AM.  Routine Hem:  09-Sep-14 07:43   WBC (CBC)  1.3  RBC (CBC)  2.47  Hemoglobin (CBC)  8.3  Hematocrit (CBC)  24.3  Platelet Count (CBC) 317  MCV 99  MCH 33.7  MCHC 34.2  RDW  21.1  Neutrophil % 41.4  Lymphocyte % 35.9  Monocyte % 18.6  Eosinophil % 3.1  Basophil % 1.0  Neutrophil #  0.5  Lymphocyte #  0.5  Monocyte # 0.2  Eosinophil # 0.0  Basophil # 0.0   Radiology Results: CT:    09-Sep-14 15:27, CT Chest With Contrast  CT Chest With Contrast   REASON FOR EXAM:    asses for lung mass, pneumonia 100 pack year smoker   with resp distress  COMMENTS:       PROCEDURE: CT  - CT CHEST WITH CONTRAST  - May 25 2013  3:27PM     RESULT: Axial CT scanning was performed through the chest with   reconstructions at 3 mm intervals and slice thicknesses following   intravenous administration of 75 cc of Isovue-370. Review of multiplanar   reconstructed images was performed separately on the VIA monitor.   Comparison is made to a study of July 17, 2011. There is  atelectasis   of much of the left upper lobe. There is a moderate sized left pleural   effusion. On the right there is minimal atelectasis of the lower lobe and   there is also a moderate sized pleural effusion. The cardiac silhouette   isnormal in size. The caliber of the thoracic aorta is normal. The     atelectatic left upper lobe is continuous with the left hilar region   consistent with the presence of lymphadenopathy. Contrast within the   pulmonary arterial tree appears normal.Within the aerated portions of   both lungs there are a few scattered 2 to 3 mm diameter nodules.     Within the upper abdomen the observed portions of the liver and spleen   are normal in appearance. There is bilateral adrenal enlargement   unchanged from October 2012. The thoracic vertebral bodies are preserved   in height with the exception of T12 where there is mild wedging. There is   also mild impression of the body of L3.    IMPRESSION:   1. There is the left upper lobe atelectasis likely secondary to a central   obstructing mass. The left upper lobe bronchus abruptly terminates on   images 39 and 40.  The findings are worrisome for malignancy  2. There are new moderate sized pleural effusions bilaterally. There is   minimal atelectasisat the right lung base posteriorly.  3. There is no evidence of CHF.  4. There is stable enlargement of both adrenal glands.    Oncologic evaluation is recommended.     Dictation Site: 2        Verified By: DAVID A. SwazilandJORDAN, M.D., MD   Assessment/Plan:  Assessment/Plan:  Assessment Prob lung cancer. This could have caused overall weakness and loss appetite all along.   Plan Since colon and EGD bx's were neg, no need for imuran at this time. Hospice tonight or tomorrow. Will sign off. Thanks.   Electronic Signatures: Lutricia Feilh, Jimena Wieczorek (MD)  (Signed 10-Sep-14 17:16)  Authored: Chief Complaint, VITAL SIGNS/ANCILLARY NOTES, Brief Assessment, Lab Results, Radiology  Results, Assessment/Plan   Last Updated: 10-Sep-14 17:16 by Lutricia Feilh, Karlin Binion (MD)

## 2015-01-06 NOTE — Op Note (Signed)
PATIENT NAME:  Stephanie Alvarez, Stephanie Alvarez MR#:  [id number] DATE OF BIRTH:  06-30-1935  DATE OF PROCEDURE:  05/25/2013  PREOPERATIVE DIAGNOSES:   1.  Escherichia coli sepsis. 2.  Myocardial infarction. 3.  Pneumonia.  POSTOPERATIVE DIAGNOSIS:  1.  Escherichia coli sepsis. 2.  Myocardial infarction. 3.  Pneumonia.  PROCEDURES:  1. Ultrasound guidance for vascular access to the right basilic vein.  2. Fluoroscopic guidance for placement of catheter.  3. Insertion of peripherally inserted central venous catheter, right arm.  SURGEON: Levora DredgeGregory Shaun Runyon, MD  ANESTHESIA: Local.   ESTIMATED BLOOD LOSS: Minimal.   INDICATION FOR PROCEDURE: Requiring IV antibiotics greater than 5 days.  DESCRIPTION OF PROCEDURE: The patient's right arm was sterilely prepped and draped, and a sterile surgical field was created. The basilic vein was accessed under direct ultrasound guidance without difficulty with a micropuncture needle and permanent image was recorded. 0.018 wire was then placed into the superior vena cava. Peel-away sheath was placed over the wire. A single lumen peripherally inserted central venous catheter was then placed over the wire and the wire and peel-away sheath were removed. The catheter tip was placed into the superior vena cava and was secured at the skin at 31 cm with a sterile dressing. The catheter withdrew blood well and flushed easily with heparinized saline. The patient tolerated procedure well.  ____________________________ Renford DillsGregory G. Tyreisha Ungar, MD ggs:aw D: 05/25/2013 10:42:17 ET T: 05/25/2013 11:10:13 ET JOB#: [id number]  cc: Renford DillsGregory G. Kanani Mowbray, MD, <Dictator> Renford DillsGREGORY G Oslo Huntsman MD ELECTRONICALLY SIGNED 05/28/2013 9:14

## 2015-01-06 NOTE — Consult Note (Signed)
Brief Consult Note: Diagnosis: Borderline elevated troponin, probable demand supply ischemia without MI.   Patient was seen by consultant.   Consult note dictated.   Comments: REC  Agree with current therapy, defer full dose anticoagulation, review echo, further rec pending echo results.  Electronic Signatures: Marcina MillardParaschos, Story Vanvranken (MD)  (Signed 30-Aug-14 10:26)  Authored: Brief Consult Note   Last Updated: 30-Aug-14 10:26 by Marcina MillardParaschos, Ariz Terrones (MD)

## 2015-01-08 NOTE — Op Note (Signed)
PATIENT NAME:  Stephanie PaschalSERRANO, Daylin MR#:  [id number] DATE OF BIRTH:  September 08, 1935  DATE OF PROCEDURE:  11/13/2011  PREOPERATIVE DIAGNOSIS: Internal and external hemorrhoids.   POSTOPERATIVE DIAGNOSIS: Internal and external hemorrhoids.  PROCEDURE: Internal and external hemorrhoidectomy.   SURGEON: Adella HareJ. Wilton Tessla Spurling, MD   ANESTHESIA: General.   INDICATIONS: This 80 year old female has a history of 50 years of hemorrhoids recently with increasing pain and bleeding. She had large internal and external hemorrhoids found on exam. She has had medical management and still having significant symptoms and also has a history of Crohn's disease and surgery was recommended for definitive treatment.   DESCRIPTION OF PROCEDURE: The patient was placed on the operating table in the supine position under general anesthesia. Legs were elevated into the lithotomy position using ankle straps. The anal area was prepared with Betadine solution and draped with sterile towels and sheets.   Initial inspection revealed that there were three large external complexes at the 4 o'clock, 8 o'clock, and 11 o'clock positions. The anoderm was infiltrated with 0.5% Sensorcaine with epinephrine using a total of 30 mL and some of this was used in the deeper tissues surrounding the sphincter. Next, the anal canal was dilated large enough to admit five fingers. The bivalve anal retractor was introduced. Further inspection revealed there were large internal hemorrhoid complexes in continuity with the external hemorrhoids. No neoplasm was seen. No evidence of Crohn's disease was seen in the immediate area. These hemorrhoids were very large.   The first hemorrhoid to be removed was at the 11 o'clock position. The bivalve anal retractor was positioned for exposure. A high ligation of the internal component was done with a 2-0 chromic suture ligature. A V-shaped incision was made externally with a scalpel. Next, electrocautery was used to dissect out  the external hemorrhoids away from the subcutaneous tissues. The internal anal sphincter was identified initially with palpation and subsequently with use of electrocautery and further dissection and some blunt dissection. The internal sphincter was further visually identified. Further dissection was carried out to dissect the hemorrhoids away from the underlying tissues up to the previously placed suture ligature. Next, the hemorrhoid was further ligated with the same suture ligature and amputated. Numerous small bleeding points were cauterized. Hemostasis was subsequently intact. The wound was closed with running locked tied 2-0 chromic sutures and a small opening was left externally for drainage.   Next, the same procedure was carried out at the 4 o'clock position with a similar suture ligation, similar external V-shaped incision, similar dissection identifying the internal sphincter, and hemorrhoid was excised in a similar manner and the wound repaired in a similar manner.   Next, the same procedure was carried out at the 8 o'clock position with similar ligation, excision, and closure. Following this, an additional 10 mL of 0.5% Sensorcaine with epinephrine were injected. Hemostasis was intact. Dressings were applied with paper tape. The patient tolerated surgery satisfactorily and is now being prepared for transfer to the recovery room.   ____________________________ Shela CommonsJ. Renda RollsWilton Estevan Kersh, MD jws:drc D: 11/13/2011 09:02:28 ET T: 11/13/2011 09:59:31 ET JOB#: [id number]  cc: Adella HareJ. Wilton Paullette Mckain, MD, <Dictator> Adella HareWILTON J Naryiah Schley MD ELECTRONICALLY SIGNED 11/13/2011 15:07
